# Patient Record
Sex: Female | Born: 1940 | Race: White | Hispanic: No | Marital: Married | Smoking: Former smoker
Health system: Southern US, Community
[De-identification: ages and names within clinical notes are randomized; demographics above are authoritative.]

## PROBLEM LIST (undated history)

## (undated) DIAGNOSIS — E785 Hyperlipidemia, unspecified: Secondary | ICD-10-CM

## (undated) DIAGNOSIS — I10 Essential (primary) hypertension: Secondary | ICD-10-CM

## (undated) DIAGNOSIS — E119 Type 2 diabetes mellitus without complications: Secondary | ICD-10-CM

---

## 1987-05-10 HISTORY — PX: ABDOMINAL HYSTERECTOMY: SHX81

## 1994-05-09 HISTORY — PX: CHOLECYSTECTOMY: SHX55

## 1998-05-09 HISTORY — PX: BLADDER SUSPENSION: SHX72

## 2000-05-09 HISTORY — PX: TRANSVAGINAL TAPE (TVT) REMOVAL: SHX6154

## 2005-05-09 HISTORY — PX: OTHER SURGICAL HISTORY: SHX169

## 2005-05-09 HISTORY — PX: HAND SURGERY: SHX662

## 2005-05-09 HISTORY — PX: CYSTOCELE REPAIR: SHX163

## 2021-03-16 ENCOUNTER — Emergency Department (HOSPITAL_COMMUNITY): Payer: Medicare HMO

## 2021-03-16 ENCOUNTER — Observation Stay (HOSPITAL_COMMUNITY)
Admission: EM | Admit: 2021-03-16 | Discharge: 2021-03-17 | Disposition: A | Discharge status: Home / Self Care | Payer: Medicare HMO | Attending: Emergency Medicine | Admitting: Emergency Medicine

## 2021-03-16 ENCOUNTER — Encounter (HOSPITAL_COMMUNITY): Payer: Self-pay

## 2021-03-16 DIAGNOSIS — E119 Type 2 diabetes mellitus without complications: Secondary | ICD-10-CM | POA: Diagnosis not present

## 2021-03-16 DIAGNOSIS — R4182 Altered mental status, unspecified: Secondary | ICD-10-CM | POA: Diagnosis present

## 2021-03-16 DIAGNOSIS — K219 Gastro-esophageal reflux disease without esophagitis: Secondary | ICD-10-CM | POA: Diagnosis not present

## 2021-03-16 DIAGNOSIS — R339 Retention of urine, unspecified: Secondary | ICD-10-CM | POA: Diagnosis present

## 2021-03-16 DIAGNOSIS — F419 Anxiety disorder, unspecified: Secondary | ICD-10-CM

## 2021-03-16 DIAGNOSIS — G4700 Insomnia, unspecified: Secondary | ICD-10-CM | POA: Diagnosis not present

## 2021-03-16 DIAGNOSIS — M1990 Unspecified osteoarthritis, unspecified site: Secondary | ICD-10-CM

## 2021-03-16 DIAGNOSIS — Z20822 Contact with and (suspected) exposure to covid-19: Secondary | ICD-10-CM | POA: Diagnosis not present

## 2021-03-16 DIAGNOSIS — Z79899 Other long term (current) drug therapy: Secondary | ICD-10-CM | POA: Diagnosis not present

## 2021-03-16 DIAGNOSIS — I10 Essential (primary) hypertension: Secondary | ICD-10-CM | POA: Diagnosis not present

## 2021-03-16 DIAGNOSIS — E785 Hyperlipidemia, unspecified: Secondary | ICD-10-CM | POA: Diagnosis present

## 2021-03-16 DIAGNOSIS — R531 Weakness: Secondary | ICD-10-CM | POA: Diagnosis not present

## 2021-03-16 HISTORY — DX: Hyperlipidemia, unspecified: E78.5

## 2021-03-16 HISTORY — DX: Type 2 diabetes mellitus without complications: E11.9

## 2021-03-16 HISTORY — DX: Essential (primary) hypertension: I10

## 2021-03-16 LAB — CBC WITH DIFFERENTIAL/PLATELET
Abs Immature Granulocytes: 0.01 10*3/uL (ref 0.00–0.07)
Basophils Absolute: 0 10*3/uL (ref 0.0–0.1)
Basophils Relative: 1 %
Eosinophils Absolute: 0.2 10*3/uL (ref 0.0–0.5)
Eosinophils Relative: 3 %
HCT: 38.9 % (ref 36.0–46.0)
Hemoglobin: 12.4 g/dL (ref 12.0–15.0)
Immature Granulocytes: 0 %
Lymphocytes Relative: 47 %
Lymphs Abs: 2.3 10*3/uL (ref 0.7–4.0)
MCH: 28.8 pg (ref 26.0–34.0)
MCHC: 31.9 g/dL (ref 30.0–36.0)
MCV: 90.5 fL (ref 80.0–100.0)
Monocytes Absolute: 0.5 10*3/uL (ref 0.1–1.0)
Monocytes Relative: 10 %
Neutro Abs: 1.9 10*3/uL (ref 1.7–7.7)
Neutrophils Relative %: 39 %
Platelets: 211 10*3/uL (ref 150–400)
RBC: 4.3 MIL/uL (ref 3.87–5.11)
RDW: 13.6 % (ref 11.5–15.5)
WBC: 4.9 10*3/uL (ref 4.0–10.5)
nRBC: 0 % (ref 0.0–0.2)

## 2021-03-16 LAB — I-STAT CHEM 8, ED
BUN: 18 mg/dL (ref 8–23)
Calcium, Ion: 1.09 mmol/L — ABNORMAL LOW (ref 1.15–1.40)
Chloride: 106 mmol/L (ref 98–111)
Creatinine, Ser: 0.6 mg/dL (ref 0.44–1.00)
Glucose, Bld: 99 mg/dL (ref 70–99)
HCT: 37 % (ref 36.0–46.0)
Hemoglobin: 12.6 g/dL (ref 12.0–15.0)
Potassium: 3.7 mmol/L (ref 3.5–5.1)
Sodium: 142 mmol/L (ref 135–145)
TCO2: 28 mmol/L (ref 22–32)

## 2021-03-16 LAB — COMPREHENSIVE METABOLIC PANEL
ALT: 22 U/L (ref 0–44)
AST: 24 U/L (ref 15–41)
Albumin: 3.1 g/dL — ABNORMAL LOW (ref 3.5–5.0)
Alkaline Phosphatase: 69 U/L (ref 38–126)
Anion gap: 8 (ref 5–15)
BUN: 15 mg/dL (ref 8–23)
CO2: 26 mmol/L (ref 22–32)
Calcium: 8.4 mg/dL — ABNORMAL LOW (ref 8.9–10.3)
Chloride: 107 mmol/L (ref 98–111)
Creatinine, Ser: 0.68 mg/dL (ref 0.44–1.00)
GFR, Estimated: >60 mL/min (ref >60)
Glucose, Bld: 95 mg/dL (ref 70–99)
Potassium: 3.8 mmol/L (ref 3.5–5.1)
Sodium: 141 mmol/L (ref 135–145)
Total Bilirubin: 0.5 mg/dL (ref 0.3–1.2)
Total Protein: 5.7 g/dL — ABNORMAL LOW (ref 6.5–8.1)

## 2021-03-16 LAB — RAPID URINE DRUG SCREEN, HOSP PERFORMED
Amphetamines: NOT DETECTED
Barbiturates: NOT DETECTED
Benzodiazepines: POSITIVE — AB
Cocaine: NOT DETECTED
Opiates: NOT DETECTED
Tetrahydrocannabinol: NOT DETECTED

## 2021-03-16 LAB — TSH: TSH: 1.398 u[IU]/mL (ref 0.350–4.500)

## 2021-03-16 LAB — RESP PANEL BY RT-PCR (FLU A&B, COVID) ARPGX2
Influenza A by PCR: NEGATIVE
Influenza B by PCR: NEGATIVE
SARS Coronavirus 2 by RT PCR: NEGATIVE

## 2021-03-16 LAB — URINALYSIS, ROUTINE W REFLEX MICROSCOPIC
Bilirubin Urine: NEGATIVE
Glucose, UA: NEGATIVE mg/dL
Hgb urine dipstick: NEGATIVE
Ketones, ur: NEGATIVE mg/dL
Leukocytes,Ua: NEGATIVE
Nitrite: NEGATIVE
Protein, ur: NEGATIVE mg/dL
Specific Gravity, Urine: 1.008 (ref 1.005–1.030)
pH: 7 (ref 5.0–8.0)

## 2021-03-16 LAB — PROTIME-INR
INR: 1.1 (ref 0.8–1.2)
Prothrombin Time: 14.1 seconds (ref 11.4–15.2)

## 2021-03-16 LAB — AMMONIA: Ammonia: 13 umol/L (ref 9–35)

## 2021-03-16 LAB — ETHANOL: Alcohol, Ethyl (B): <10 mg/dL (ref <10)

## 2021-03-16 LAB — LACTIC ACID, PLASMA: Lactic Acid, Venous: 1.4 mmol/L (ref 0.5–1.9)

## 2021-03-16 LAB — APTT: aPTT: 34 seconds (ref 24–36)

## 2021-03-16 IMAGING — CT CT ANGIO HEAD-NECK (W OR W/O PERF)
2 of 7 series · 8 of 33 positions shown · IV contrast (OMNI 350)
Comparison: None.

CLINICAL DATA: Neuro deficit, acute, stroke suspected

EXAM:
CT ANGIOGRAPHY HEAD AND NECK
TECHNIQUE: Multidetector CT imaging of the head and neck was performed using
the standard protocol during bolus administration of intravenous
contrast. Multiplanar CT image reconstructions and MIPs were
obtained to evaluate the vascular anatomy. Carotid stenosis
measurements (when applicable) are obtained utilizing NASCET
criteria, using the distal internal carotid diameter as the
denominator.
CONTRAST:  75mL OMNIPAQUE IOHEXOL 350 MG/ML SOLN

[Series 5: cta neck · axial · 0.50mm/px · z∈[-211,-89]mm · 2 of 157 slices shown]
[im 53/157  soft-tissue]
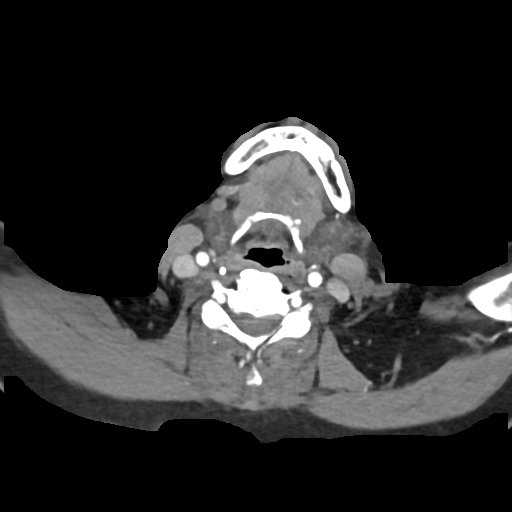
[im 105/157  soft-tissue]
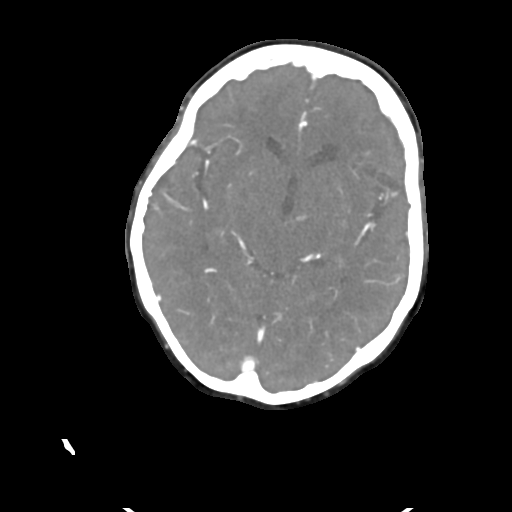

[Series 7: cta neck axial · axial · 0.39mm/px · z∈[-269,-36]mm · 6 of 325 slices shown]
[im 47/325  soft-tissue]
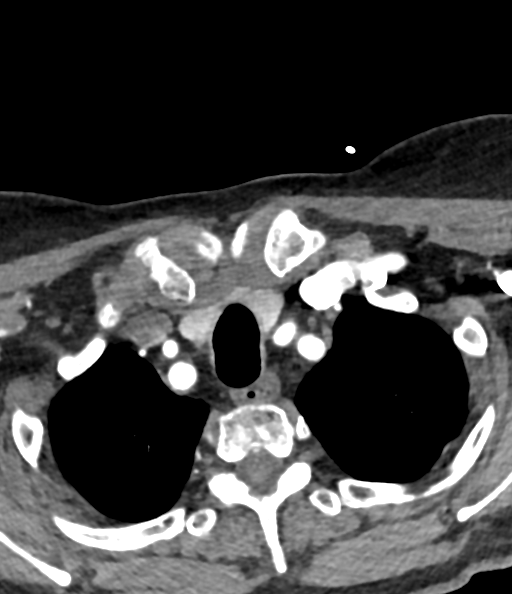
[im 93/325  bone]
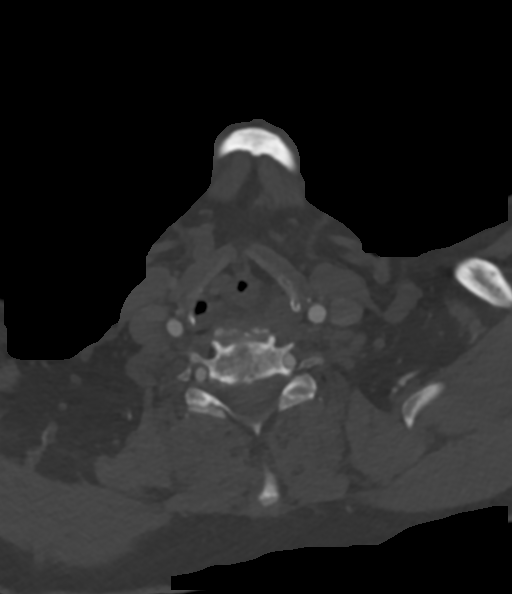
[im 139/325  soft-tissue]
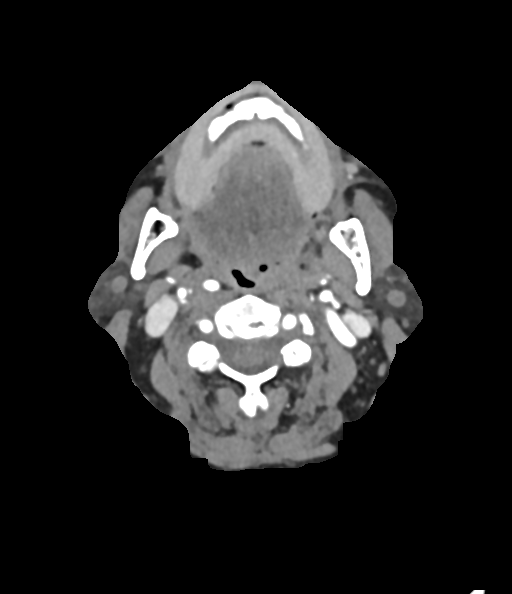
[im 186/325  bone]
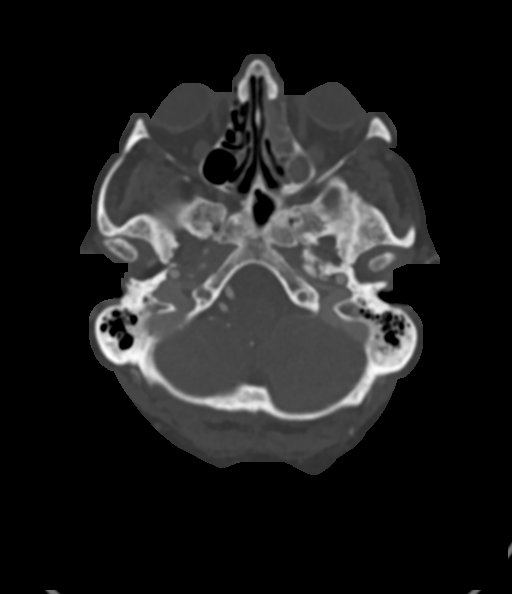
[im 232/325  soft-tissue]
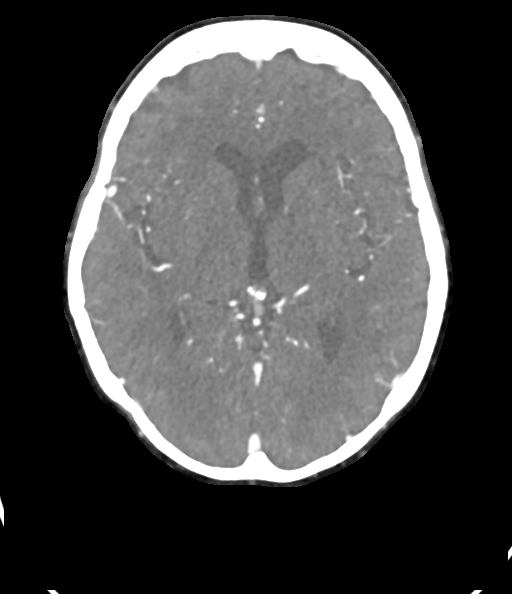
[im 278/325  bone]
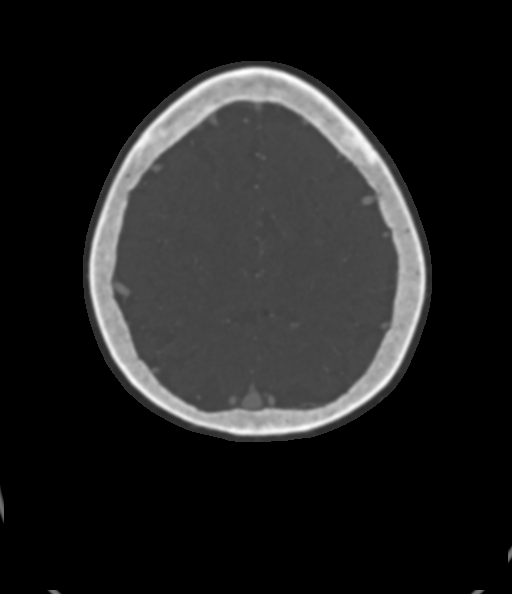

[8 of 33 positions shown; findings below may reference images not displayed]

FINDINGS: CTA NECK

Aortic arch: Minimal calcified plaque along the arch. Patent great
vessel origins.

Right carotid system: Patent. No stenosis. Partially retropharyngeal
course.

Left carotid system: Patent. Trace calcified plaque along the
proximal internal carotid. No stenosis.

Vertebral arteries: Patent.  Codominant.  No stenosis.

Skeleton: Cervical spine degenerative changes.

Other neck: Unremarkable.

Upper chest: 3 mm nodule of the left upper lobe (series 5, image
165).

Review of the MIP images confirms the above findings

CTA HEAD

Anterior circulation: Intracranial internal carotid arteries are
patent with mild calcified plaque. Anterior and middle cerebral
arteries are patent.

Posterior circulation: Intracranial vertebral arteries are patent.
Basilar artery is patent. Major cerebellar artery origins are
patent. Posterior cerebral arteries are patent.

Venous sinuses: Patent as allowed by contrast bolus timing.

Review of the MIP images confirms the above findings
IMPRESSION: No large vessel occlusion, hemodynamically significant stenosis, or
evidence of dissection.

3 mm left upper lobe pulmonary nodule. No follow-up needed if
patient is low-risk. Non-contrast chest CT can be considered in 12
months if patient is high-risk. This recommendation follows the
consensus statement: Guidelines for Management of Incidental
Pulmonary Nodules Detected on CT Images: From the [HOSPITAL]

## 2021-03-16 IMAGING — MR MR HEAD W/O CM
6 of 10 series · 29 of 48 positions shown · non-contrast
Comparison: None.

CLINICAL DATA: Neuro deficit, acute, stroke suspected

EXAM:
MRI HEAD WITHOUT CONTRAST
TECHNIQUE: Multiplanar, multiecho pulse sequences of the brain and surrounding
structures were obtained without intravenous contrast.

[Series 2: DWI · axial · 3.0mm · 0.94mm/px · z∈[-129,+18]mm · 9 of 100 slices shown (1 of 2)]
[im 1/100]
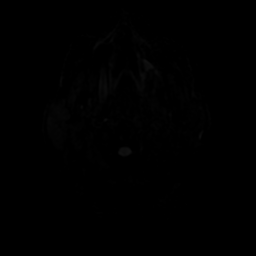
[im 13/100]
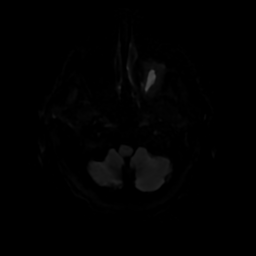
[im 25/100]
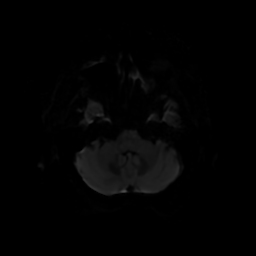
[im 38/100]
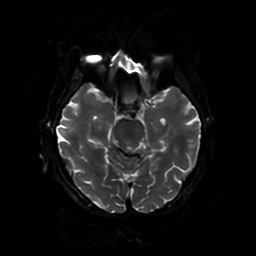
[im 50/100]
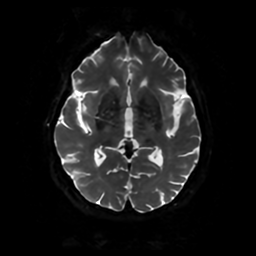
[im 62/100]
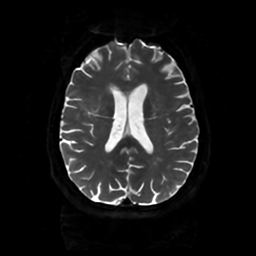
[im 75/100]
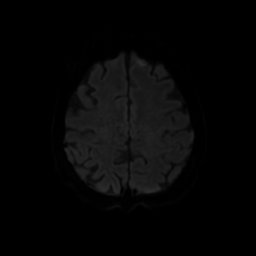
[im 87/100]
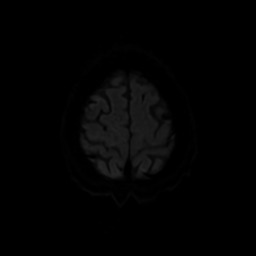
[im 100/100]
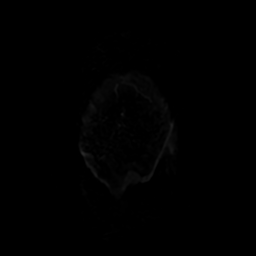

[Series 3: DWI · coronal · 4.0mm · 0.94mm/px · 7 of 72 slices shown (2 of 2)]
[im 1/72]
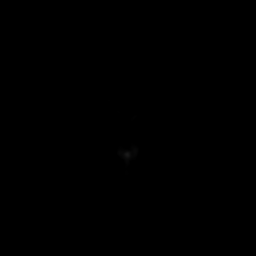
[im 12/72]
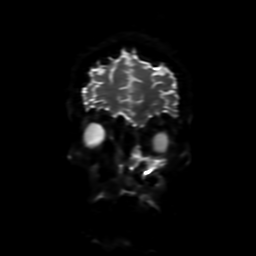
[im 24/72]
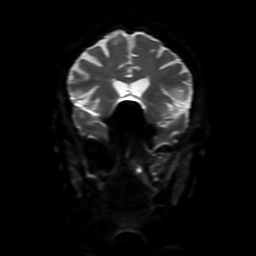
[im 36/72]
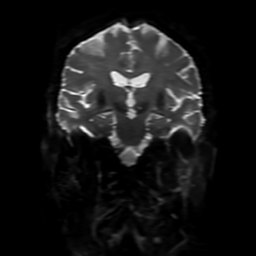
[im 48/72]
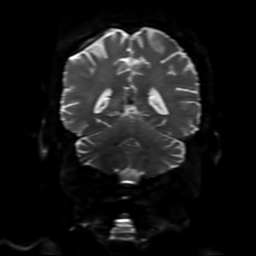
[im 60/72]
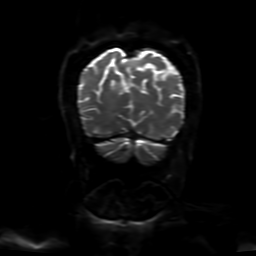
[im 72/72]
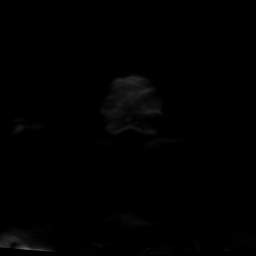

[Series 4: FLAIR · sagittal · 5.0mm · 0.23mm/px · 2 of 24 slices shown (1 of 2)]
[im 1/24]
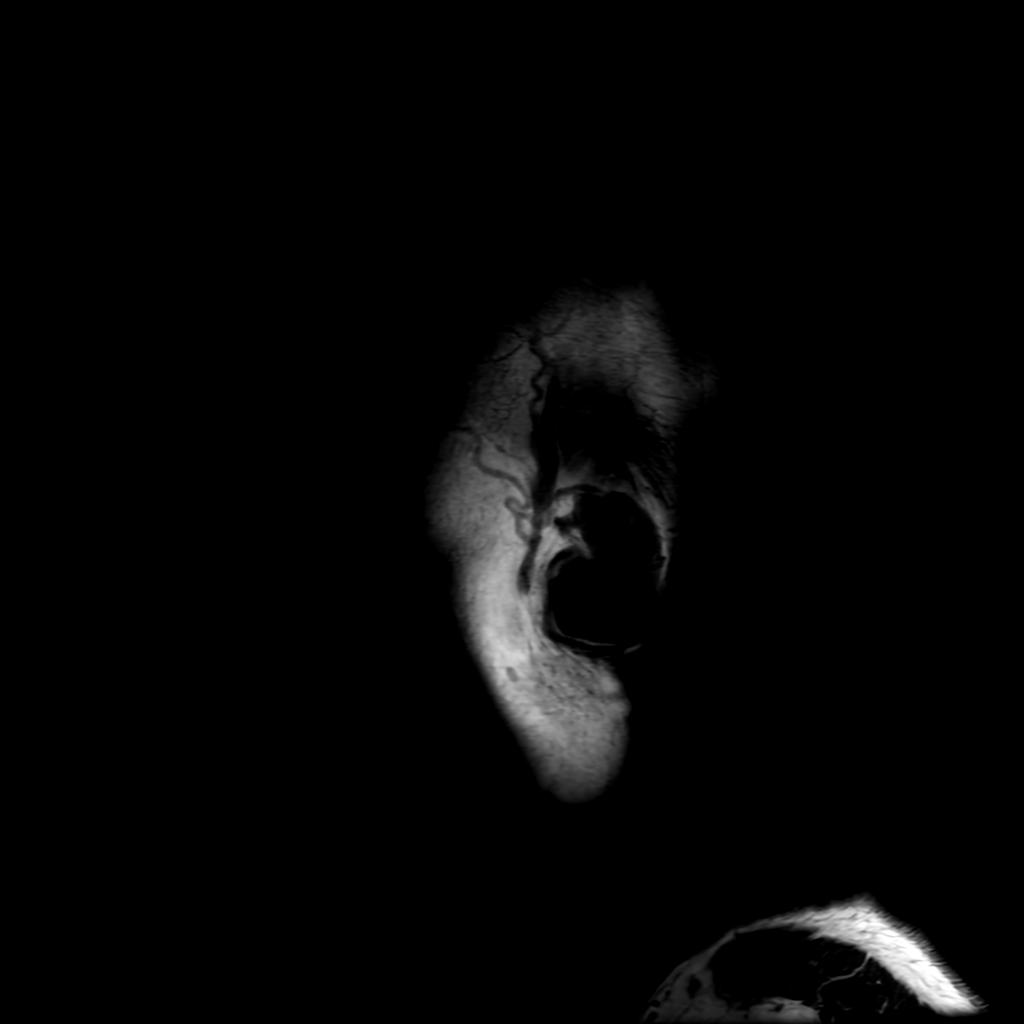
[im 24/24]
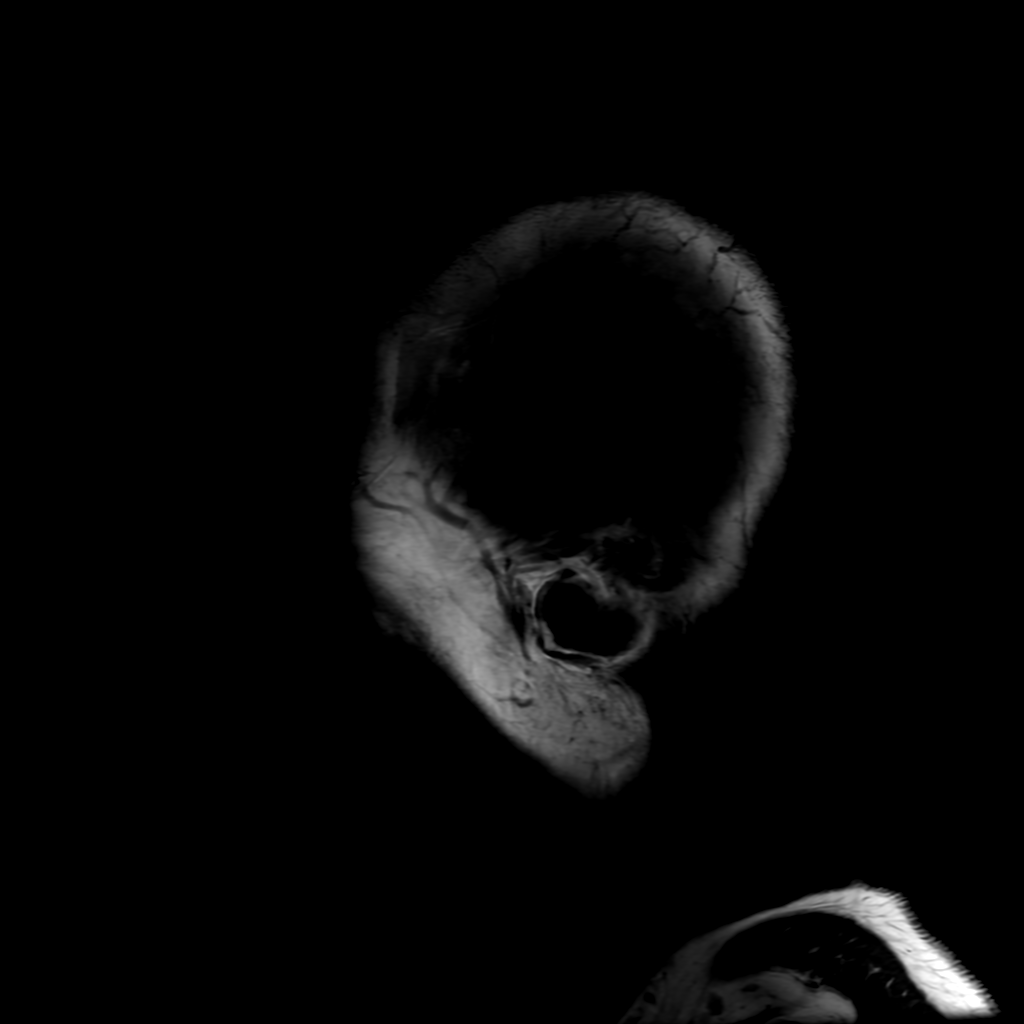

[Series 6: FLAIR · axial · 4.0mm · 0.45mm/px · z∈[-121,+24]mm · 3 of 34 slices shown (2 of 2)]
[im 1/34]
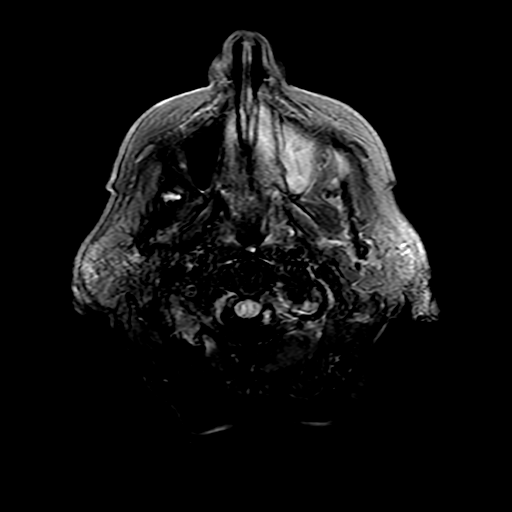
[im 17/34]
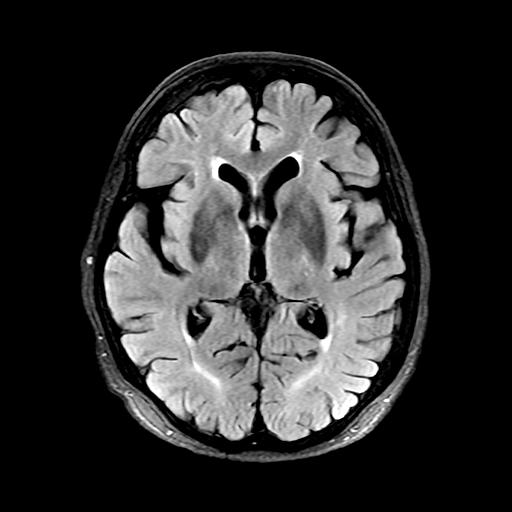
[im 34/34]
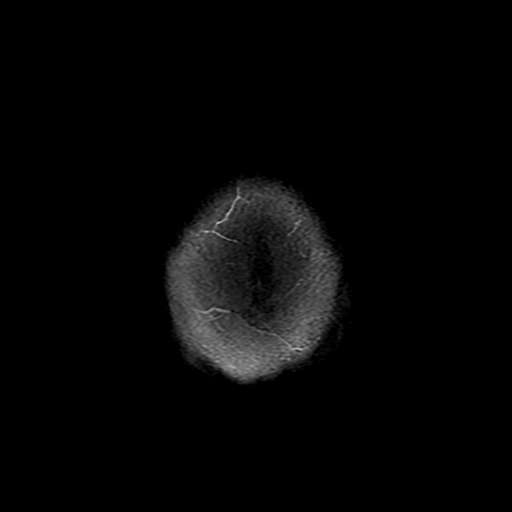

[Series 250: ADC · axial · 3.0mm · 0.94mm/px · z∈[-129,+18]mm · 5 of 50 slices shown (1 of 2)]
[im 1/50]
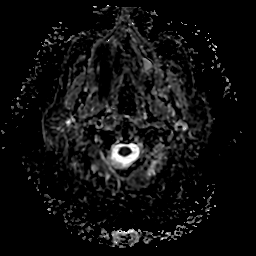
[im 13/50]
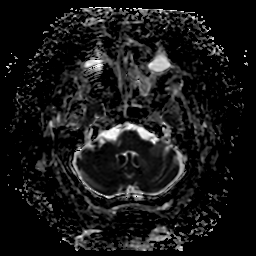
[im 25/50]
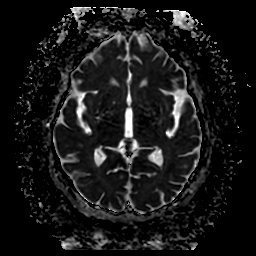
[im 37/50]
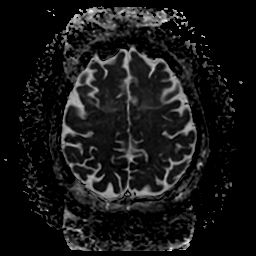
[im 50/50]
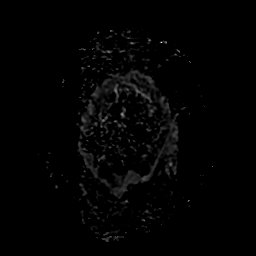

[Series 350: ADC · coronal · 4.0mm · 0.94mm/px · 3 of 37 slices shown (2 of 2)]
[im 1/37]
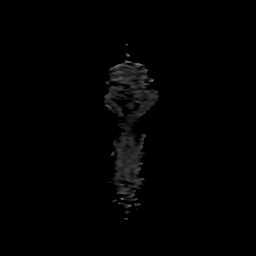
[im 19/37]
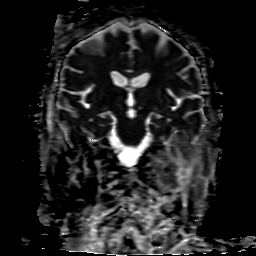
[im 37/37]
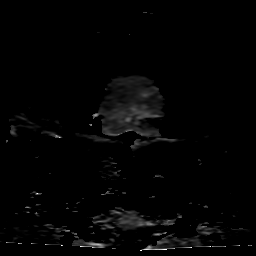

[29 of 48 positions shown; findings below may reference images not displayed]

FINDINGS: Brain: There is no acute infarction or intracranial hemorrhage.
There is no intracranial mass, mass effect, or edema. There is no
hydrocephalus or extra-axial fluid collection. Prominence of the
ventricles and sulci reflects parenchymal volume loss. Patchy T2
hyperintensity in the supratentorial white matter is nonspecific but
may reflect mild chronic microvascular ischemic changes.

Vascular: Major vessel flow voids at the skull base are preserved.

Skull and upper cervical spine: Normal marrow signal is preserved.
Cervical spine degenerative changes.

Sinuses/Orbits: Opacified left maxillary anterior ethmoid sinuses
and. Patchy mucosal thickening elsewhere. Bilateral lens
replacements.

Other: Sella is unremarkable.  Mastoid air cells are clear.
IMPRESSION: No acute infarction, hemorrhage, or mass. Mild chronic microvascular
ischemic changes.

## 2021-03-16 IMAGING — MR MR CERVICAL SPINE W/O CM
4 of 5 series · 19 of 48 positions shown · non-contrast
Comparison: None.

CLINICAL DATA: Motor neuron disease

EXAM:
MRI CERVICAL SPINE WITHOUT CONTRAST
TECHNIQUE: Multiplanar, multisequence MR imaging of the cervical spine was
performed. No intravenous contrast was administered.

[Series 2: STIR · sagittal · 3.0mm · 0.43mm/px · 3 of 16 slices shown]
[im 4/16]
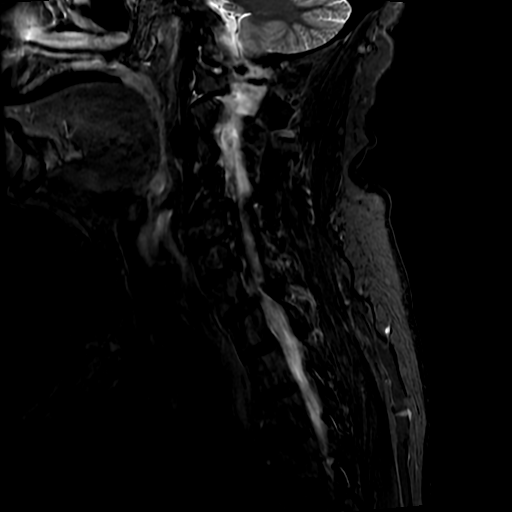
[im 10/16]
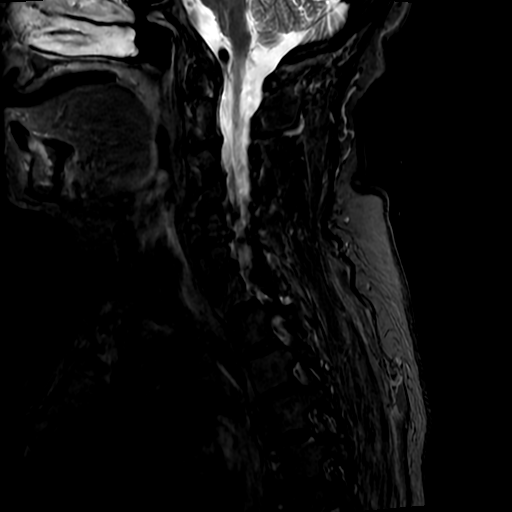
[im 16/16]
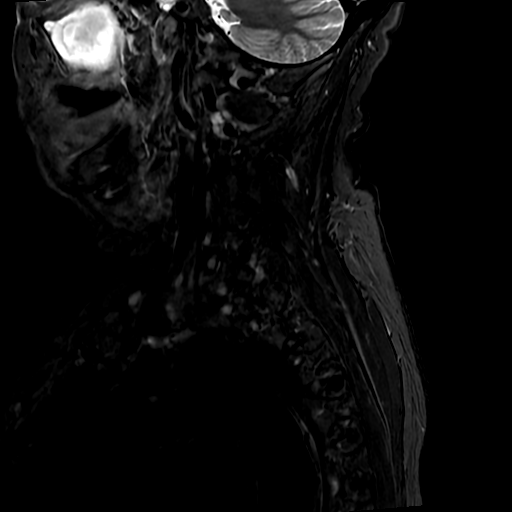

[Series 3: T2 · sagittal · 3.0mm · 0.43mm/px · 5 of 16 slices shown (1 of 2)]
[im 1/16]
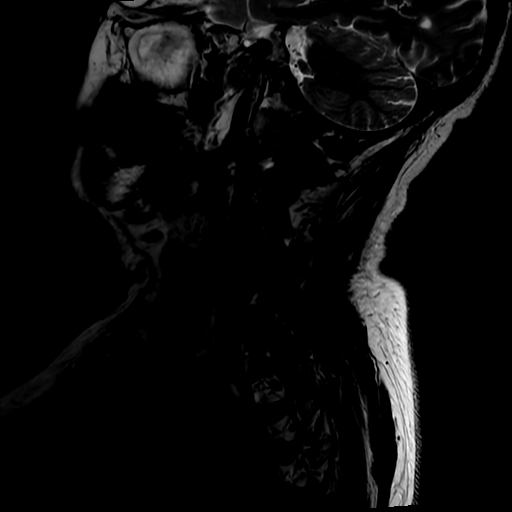
[im 4/16]
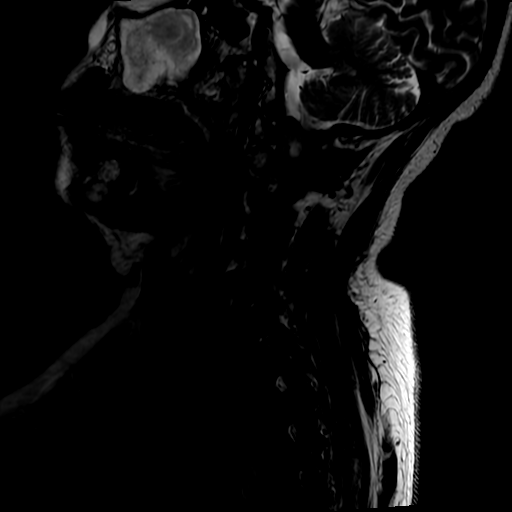
[im 8/16]
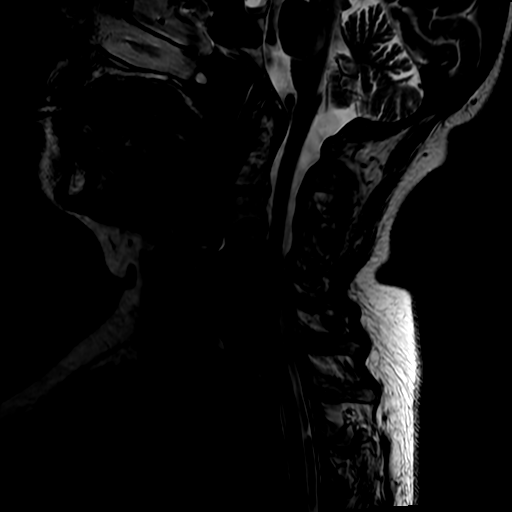
[im 12/16]
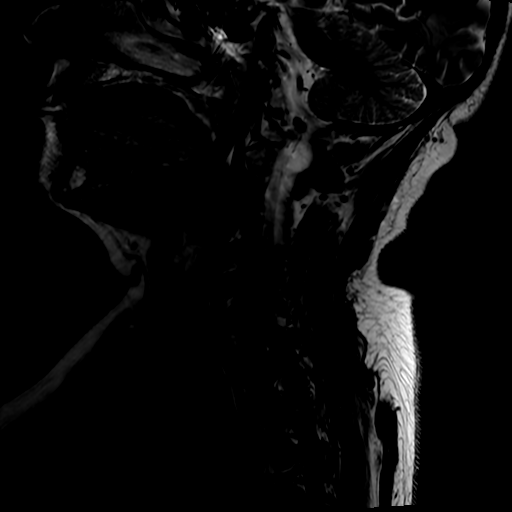
[im 16/16]
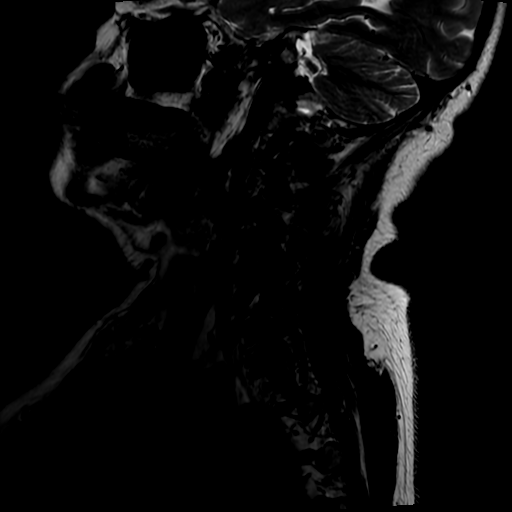

[Series 3: FLAIR · sagittal · 3.0mm · 0.43mm/px · 3 of 16 slices shown]
[im 1/16]
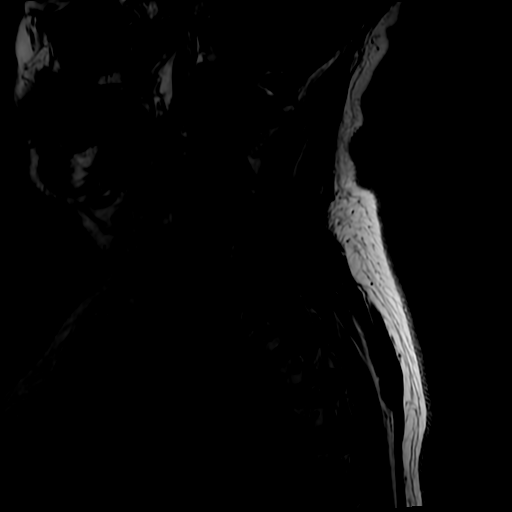
[im 8/16]
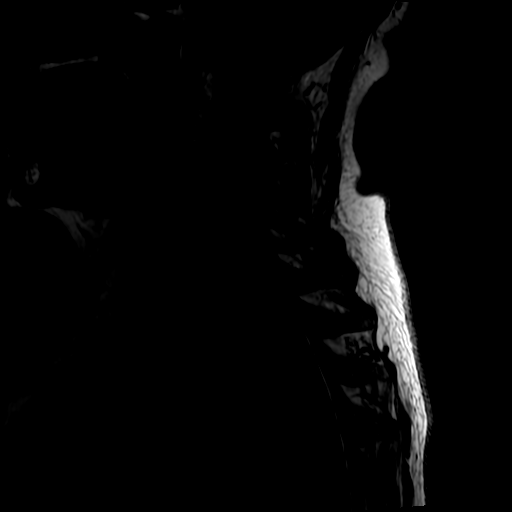
[im 16/16]
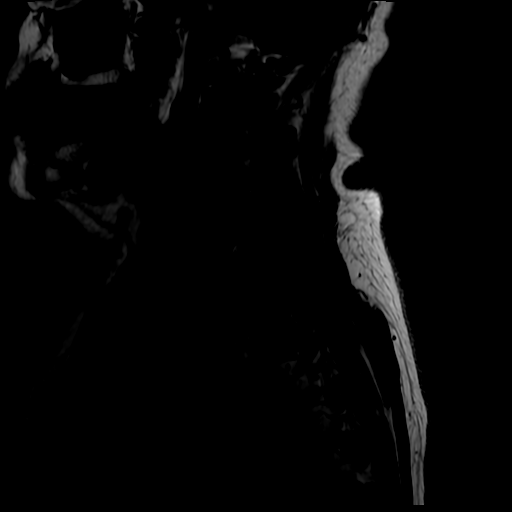

[Series 5: T2 · axial · 3.0mm · 0.35mm/px · z∈[-255,-173]mm · 8 of 26 slices shown (2 of 2)]
[im 1/26]
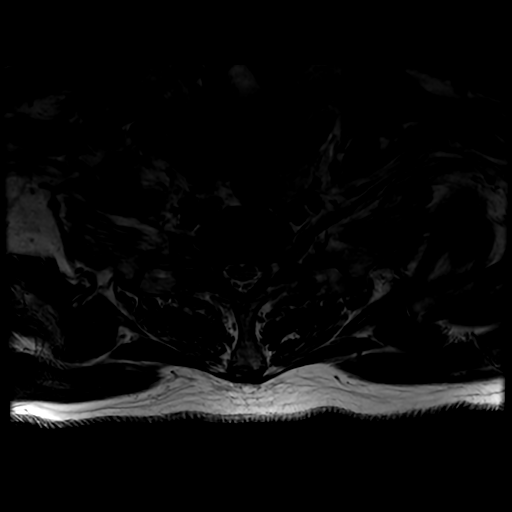
[im 4/26]
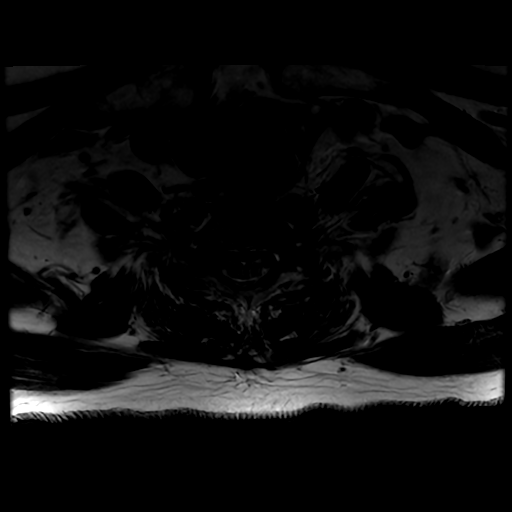
[im 8/26]
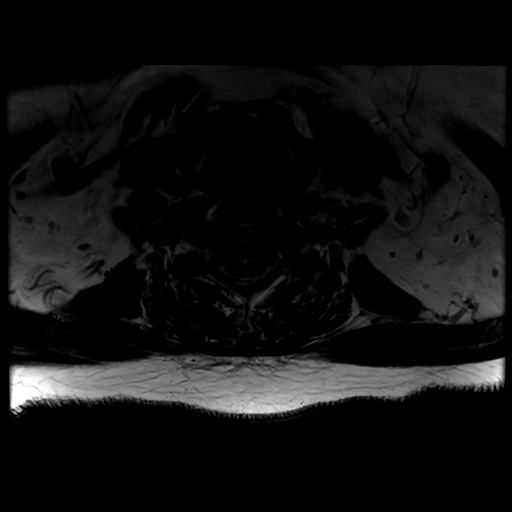
[im 11/26]
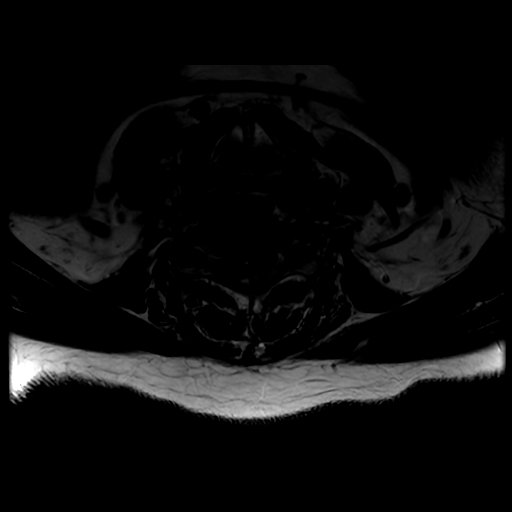
[im 15/26]
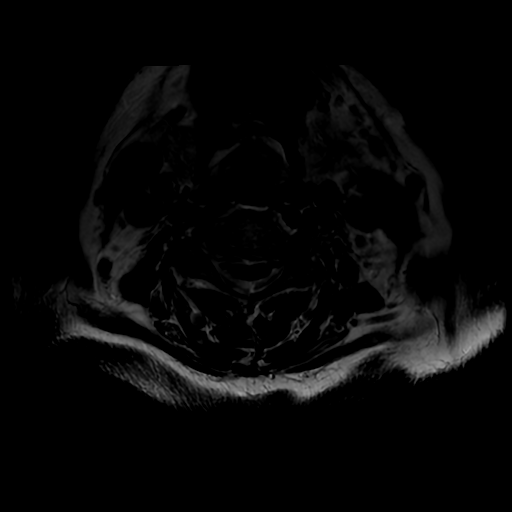
[im 18/26]
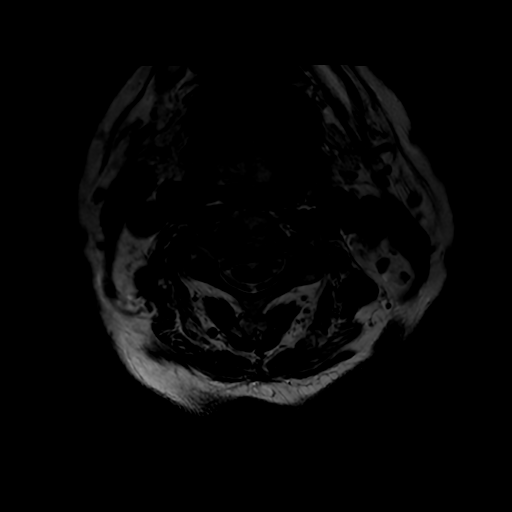
[im 22/26]
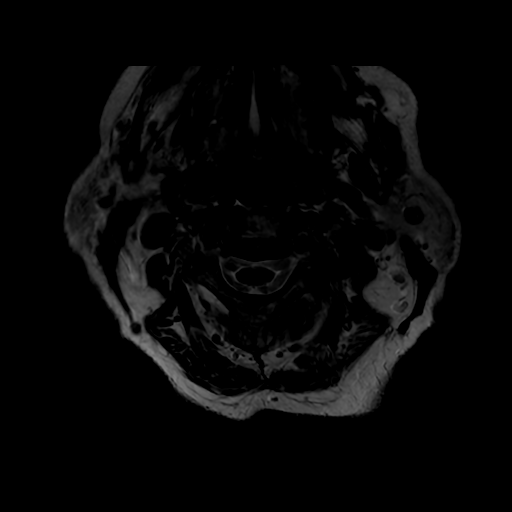
[im 26/26]
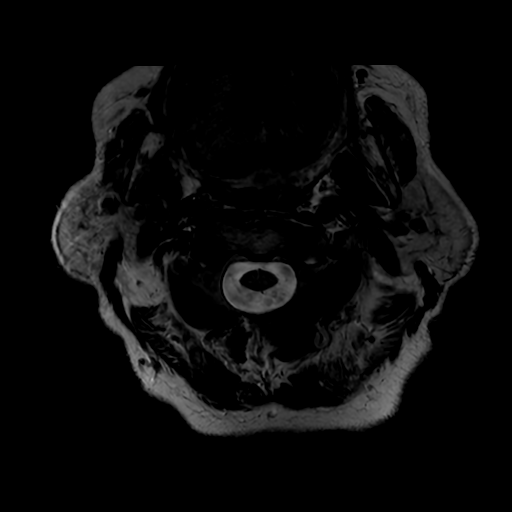

[19 of 48 positions shown; findings below may reference images not displayed]

FINDINGS: Motion artifact is present.

Alignment: Mild retrolisthesis at C4-C5 and C5-C6.

Vertebrae: Degenerative endplate irregularity, greatest at C4-C5 and
C5-C6. Vertebral body heights are otherwise maintained. There is no
substantial marrow edema.

Cord: No abnormal signal.

Posterior Fossa, vertebral arteries, paraspinal tissues:
Unremarkable.

Disc levels:

C2-C3: Facet hypertrophy.  No canal or foraminal stenosis.

C3-C4: Minimal disc bulge. Facet hypertrophy. No canal or foraminal
stenosis.

C4-C5: Disc bulge with endplate osteophytes. Facet and uncovertebral
hypertrophy. No canal or left foraminal stenosis. Mild right
foraminal stenosis.

C5-C6: Disc bulge with endplate osteophytes. Uncovertebral and facet
hypertrophy. Mild canal stenosis. Mild right and moderate left
foraminal stenosis.

C6-C7: Disc bulge with endplate osteophytes. Uncovertebral
hypertrophy. No canal or foraminal stenosis.

C7-T1: Facet hypertrophy. No canal stenosis. Mild foraminal
stenosis.
IMPRESSION: Suboptimal valuation due to motion degradation. No abnormal cord
signal. Multilevel degenerative changes without high-grade canal
stenosis. Foraminal stenosis ranges from mild to moderate.

## 2021-03-16 IMAGING — CT CT HEAD W/O CM
4 series · 16 of 47 positions shown, 18 images · non-contrast
Comparison: None.

CLINICAL DATA: Mental status change, unknown cause

EXAM:
CT HEAD WITHOUT CONTRAST
TECHNIQUE: Contiguous axial images were obtained from the base of the skull
through the vertex without intravenous contrast.

[Series 3: head without · axial · non-contrast · 0.43mm/px · z∈[-102,+18]mm · 7 of 32 slices shown, 9 images]
[im 4/32  brain]
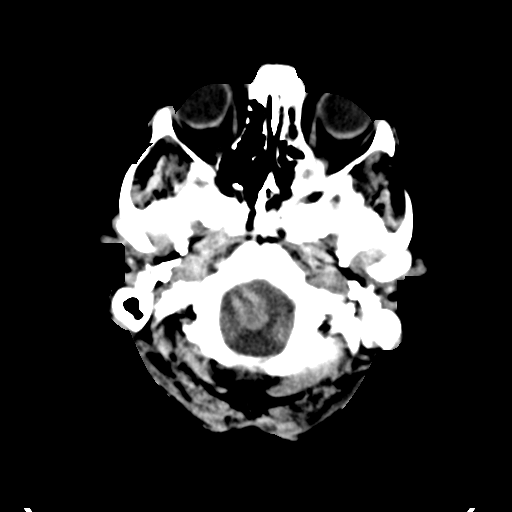
[im 4/32  bone]
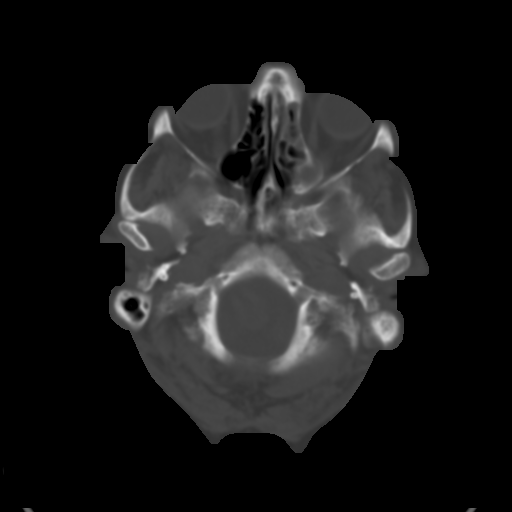
[im 8/32  brain]
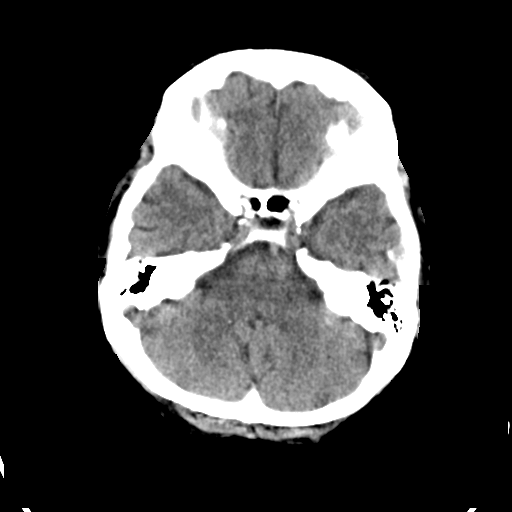
[im 12/32  brain]
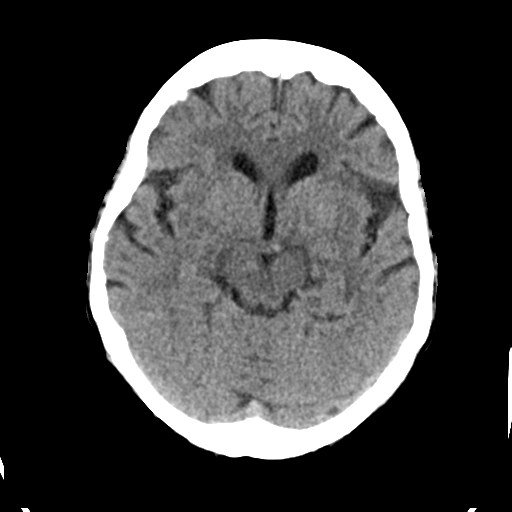
[im 16/32  brain]
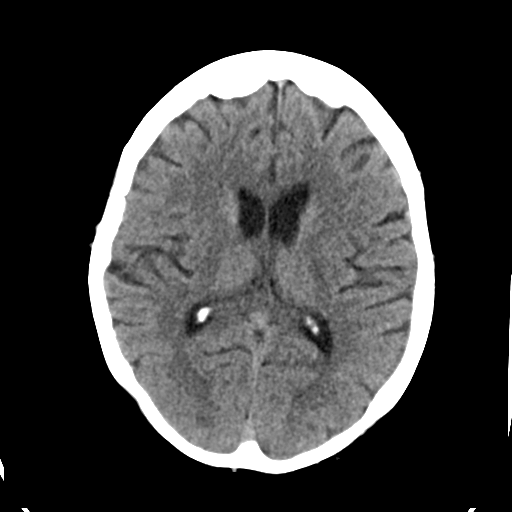
[im 20/32  brain]
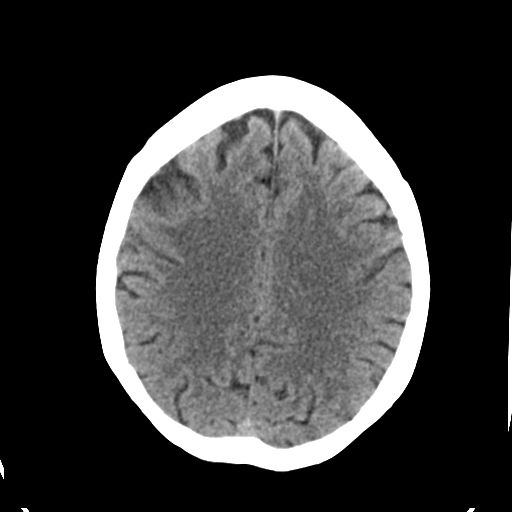
[im 20/32  bone]
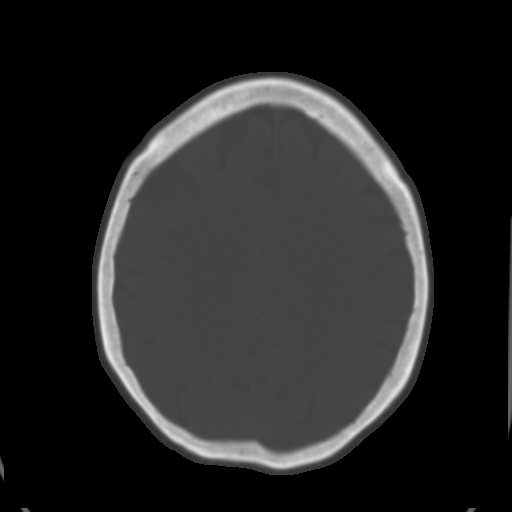
[im 24/32  brain]
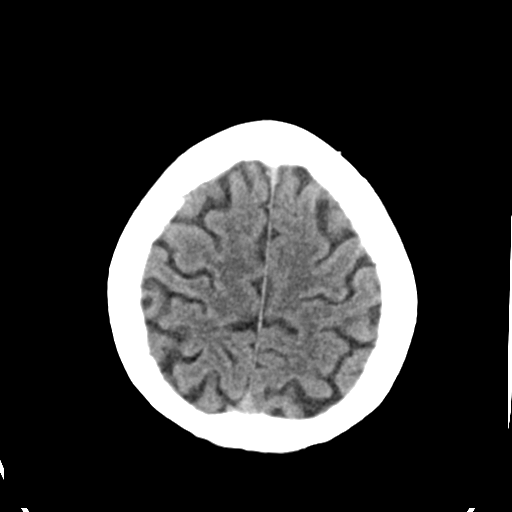
[im 28/32  brain]
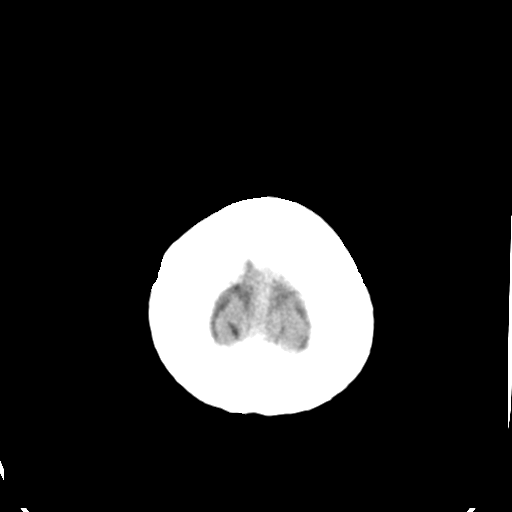

[Series 4: head bone · axial · 0.43mm/px · z∈[-103,-71]mm · 3 of 79 slices shown]
[im 8/79  bone]
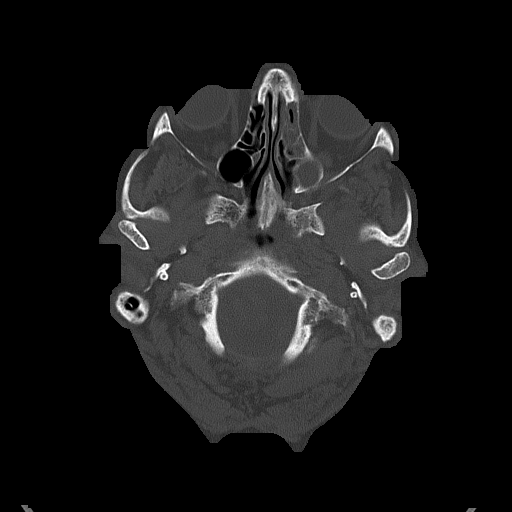
[im 16/79  bone]
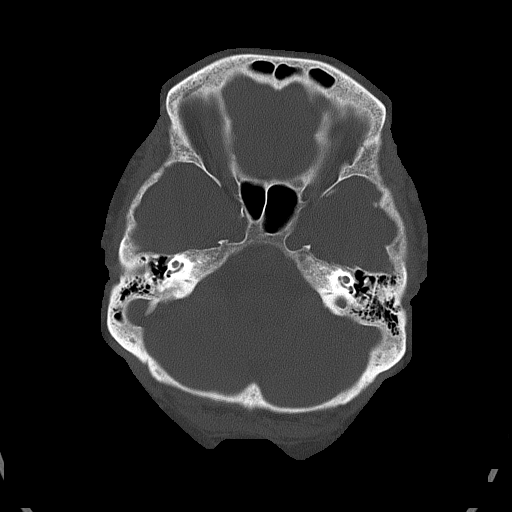
[im 24/79  bone]
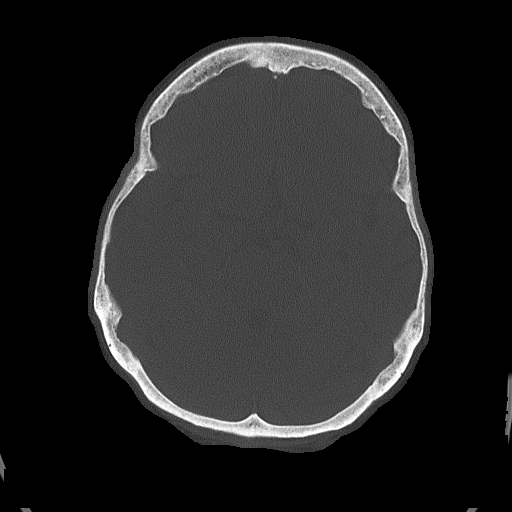

[Series 5: head without cor · coronal · non-contrast · 0.33mm/px · 3 of 62 slices shown]
[im 21/62  brain]
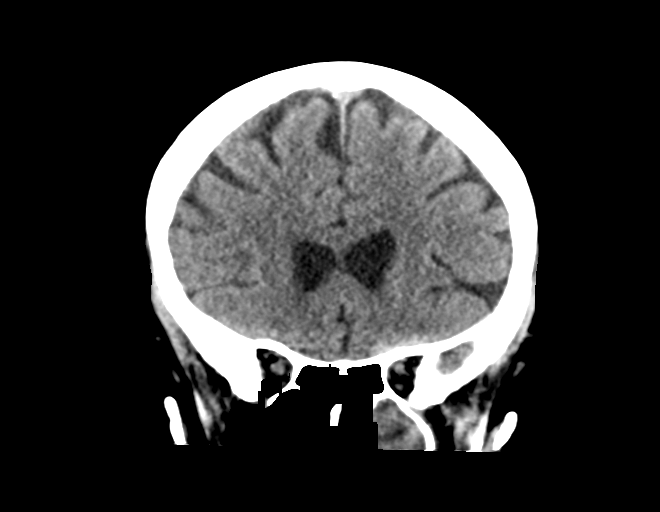
[im 28/62  brain]
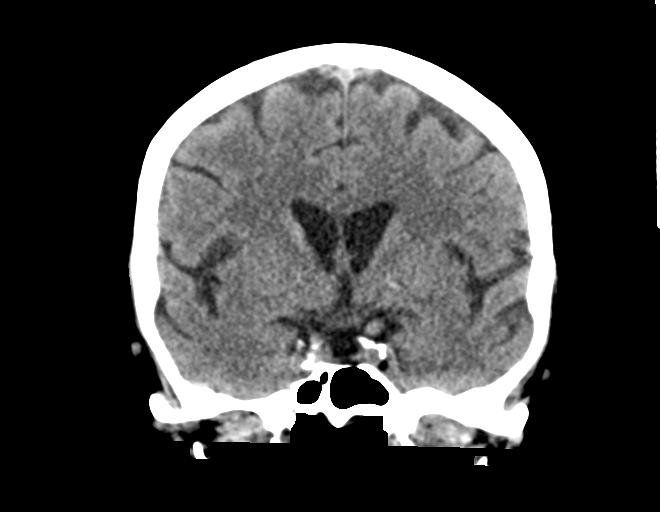
[im 34/62  brain]
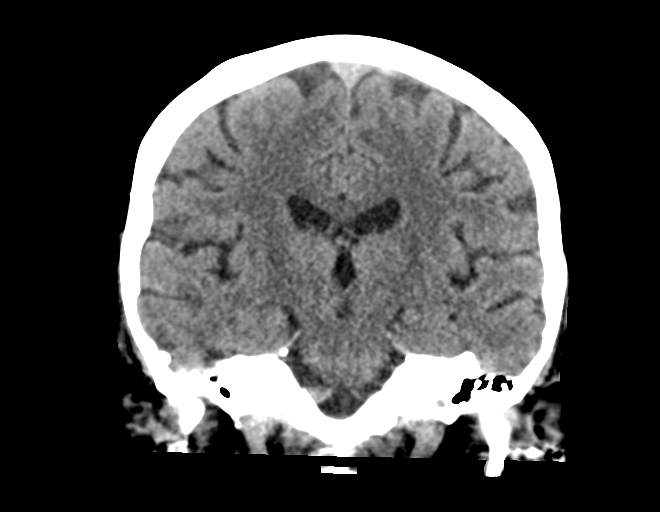

[Series 6: head without sag · sagittal · non-contrast · 0.33mm/px · 3 of 53 slices shown]
[im 18/53  brain]
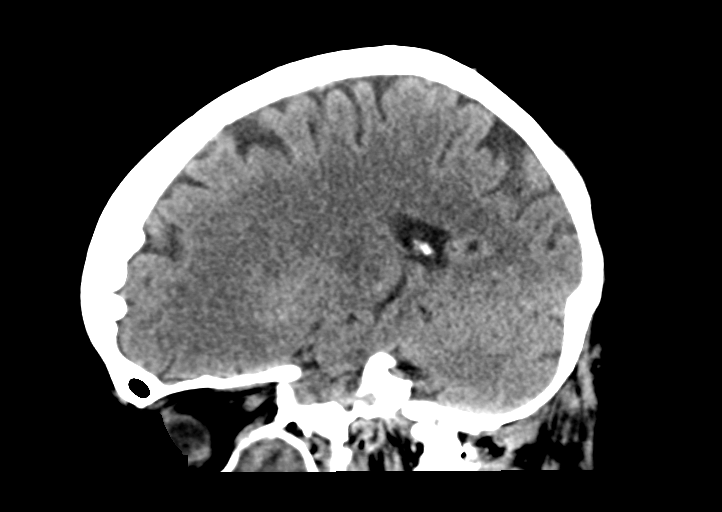
[im 27/53  brain]
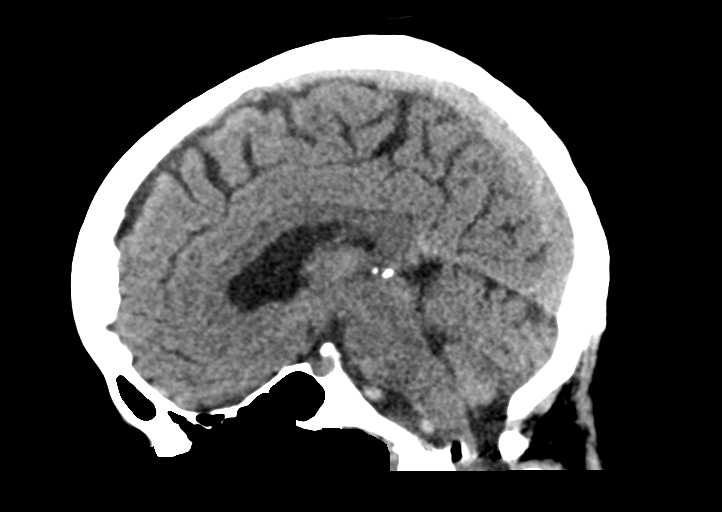
[im 35/53  brain]
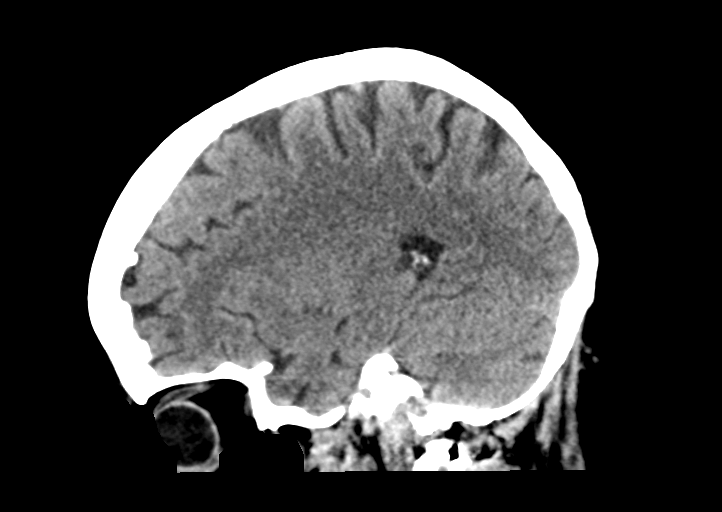

[16 of 47 positions shown; findings below may reference images not displayed]

FINDINGS: Brain: No evidence of parenchymal hemorrhage or extra-axial fluid
collection. No mass lesion, mass effect, or midline shift. No CT
evidence of acute infarction. Cerebral volume is age appropriate. No
ventriculomegaly.

Vascular: No acute abnormality.

Skull: No evidence of calvarial fracture.

Sinuses/Orbits: Complete opacification of the visualized left
maxillary sinus. Mucoperiosteal thickening in the left greater than
right ethmoidal air cells. No fluid levels.

Other:  The mastoid air cells are unopacified.
IMPRESSION: 1. No evidence of acute intracranial abnormality.
2. Chronic appearing paranasal sinusitis.

## 2021-03-16 MED ORDER — IOHEXOL 350 MG/ML SOLN
75.0000 mL | Freq: Once | INTRAVENOUS | Status: AC | PRN
  Administered 2021-03-16: 75 mL via INTRAVENOUS

## 2021-03-16 MED ORDER — ENOXAPARIN SODIUM 40 MG/0.4ML IJ SOSY
40.0000 mg | PREFILLED_SYRINGE | INTRAMUSCULAR | Status: DC
  Administered 2021-03-17: 40 mg via SUBCUTANEOUS
  Filled 2021-03-16: qty 0.4

## 2021-03-16 MED ORDER — SODIUM CHLORIDE 0.9% FLUSH: 3.0000 mL | INTRAVENOUS | Status: DC | PRN

## 2021-03-16 MED ORDER — SODIUM CHLORIDE 0.9% FLUSH
3.0000 mL | Freq: Two times a day (BID) | INTRAVENOUS | Status: DC
  Administered 2021-03-17 (×2): 3 mL via INTRAVENOUS

## 2021-03-16 MED ORDER — SODIUM CHLORIDE 0.9 % IV SOLN: 250.0000 mL | INTRAVENOUS | Status: DC | PRN

## 2021-03-16 MED ORDER — INSULIN ASPART 100 UNIT/ML IJ SOLN
0.0000 [IU] | Freq: Three times a day (TID) | INTRAMUSCULAR | Status: DC
  Administered 2021-03-17: 1 [IU] via SUBCUTANEOUS
  Administered 2021-03-17: 2 [IU] via SUBCUTANEOUS

## 2021-03-16 MED ORDER — ACETAMINOPHEN 325 MG PO TABS
650.0000 mg | ORAL_TABLET | Freq: Once | ORAL | Status: AC
  Administered 2021-03-16: 650 mg via ORAL
  Filled 2021-03-16: qty 2

## 2021-03-16 MED ORDER — SODIUM CHLORIDE 0.9 % IV BOLUS
1000.0000 mL | Freq: Once | INTRAVENOUS | Status: AC
  Administered 2021-03-16: 1000 mL via INTRAVENOUS

## 2021-03-16 NOTE — ED Triage Notes (Addendum)
Pt BIB ems from daughter's house; visiting from out of town; called out for AMS; LSN 2130 last night; pt endorses being "Sleepy", and had a HA last night, bilateral hand weakness, dry mouth; denies HA currently; family endorses increase in falls, slurred speech, weakness; ambulatory to ambulance with assistance  148/90 HR 76 CBG 121 98% RA

## 2021-03-16 NOTE — Progress Notes (Signed)
Family medicine teaching service will be admitting this patient. Our pager information can be located in the physician sticky notes, treatment team sticky notes, and the headers of all our official daily progress notes.   FAMILY MEDICINE TEACHING SERVICE Patient - Please contact intern pager [phone] or text page via website [email or web address] (login: [email or web address]) for questions regarding care. DO NOT page listed attending provider unless there is no answer from the number above.   Sharline Lehane, MD PGY-2, [facility] pager [phone]   

## 2021-03-16 NOTE — ED Provider Notes (Signed)
  7:48 PM Neuro has been delayed due to numerous code strokes.  Recommend medical admission, they will see in consult once time permits.  Patient has been able to get up and move about a little, still unsteady on her feet requiring assist and not back to her baseline.  Family is agreeable to admission.  Discussed with family practice teaching service-- will admit for ongoing care.     Garlon Hatchet, PA-C 03/16/21 2052    Linwood Dibbles, MD 03/17/21 [phone]

## 2021-03-16 NOTE — ED Notes (Signed)
Pt to MRI

## 2021-03-16 NOTE — Hospital Course (Addendum)
Patient is a 80 year old female presenting with weakness, dysarthria and altered mental status. PMH of T2DM, HTN, HLD, arthritis, acid reflux, and urinary incontinence.  Weakness  AMS Resolved by the time of admission to FPTS. Likely 2/2 medication-induced urinary retention, but cannot fully rule out TIA. CT Head and MRI Head WNL.  Per neuro: Okay to discharge with outpatient workup and Neurology evaluation. Patient is from [place] and just visiting family in [place] and reports already scheduled for Neurology follow up appointment outpatient on Jan 3rd.  Urinary Retention Patient straight cath 800 cc void in ED. On the unit, bladder scan showed retention of 497 mL retained; straight cath 600 cc. Urology to place foley catheter. Home amitriptyline will be decreased to 37.5mg  on discharge and advise discontinuation of Xanax, as additive effect can cause urinary retention.    Discharge recommendations: Incidental 3 mm left upper lobe pulmonary nodule found 11/8 on CTA head/neck. No follow-up needed if patient is low-risk. Non-contrast chest CT can be considered in 12 months if patient is high-risk. Amitriptyline dosage decreased to 37.5mg  at discharge; take one half of the pill instead of the full 75 mg tablet. Consider decreasing amount of sedating medications/potential medication interactions with polypharmacy in elderly (Xanax in combination with Amitriptyline) Urology placed foley catheter for urinary retention. Follow-up with PCP for management and safe removal.

## 2021-03-16 NOTE — ED Notes (Signed)
Pt unable to void, successful straight cath for urine. 800cc clear yellow urine, specimen obtained.

## 2021-03-16 NOTE — Progress Notes (Deleted)
Family Medicine Teaching [facility] Admission History and Physical Service Pager: [phone]  Patient name: Jasmine Berg Medical record number: [id number] Date of birth: 1940/12/02 Age: 80 y.o. Gender: female  Primary Care Provider:  Julian Reil, MD in [facility], [place] Consultants: Neurology Code Status: Full code  Preferred Emergency Contact: Husband Jasmine Berg [phone]  Chief Complaint: weakness and AMS  Assessment and Plan: Jasmine Berg is a 80 y.o. female presenting with weakness and speech changes . PMH is significant for T2DM, HTN, HLD, arthritis, acid reflux, urinary incontinence.   Weakness and AMS Patient presented to the ED with weakness and speech changes; last known normal was 9:30 pm 03/15/21. This morning upon waking, family felt she was weak and not speaking clearly, so brought her in. Upon presentation to ED, she was moderately hypertensive with otherwise stable vital signs. ED provider found her to be dysarthric, have abnormal finger-to-nose, and unable to stand without assistance. Labs notable only for UDS positive or benzos. She had a CMP that was unremarkable when calcium corrected for albumin, CBC, NH4, Lactic acid, and ethanol levels where all wnl. UDS was positive for benzodiazepines and UA was wnl.  EKG was sinus rhythm. Head imaging negative for acute processes including stroke. CT Angio head and neck showed no large vessel occlusion, hemodynamically significant stenosis or evidence of dissection, with 3 mm left upper lobe pulmonary nodule. MR brain showed no acute infarction, hemorrhage, or mass, with mild chronic microvascular ischemic changes. CT head showed no evidence of acute intracranial abnormality. MR cervical spine showed no abnormal cord signal, with multilevel degenerative changes, without high-grade canal stenosis. On admission, her vitals were stable, on physical exam she was alert and oriented to self/place/date, with regular speech  and fluidity. Strength was 5/5 in LE and 4/5 in upper extremities. Differential remains broad at this time, but includes CVA vs TIA, polypharmacy, electrolyte abnormality such as hypokalemia, hypoglycemia, and infection. Etiology of the AMS not 2/2 to CVA given imaging, could be TIA given resolution of symptoms and return to baseline with mental status. Etiology could be 2/2 polypharmacy given positive UDS for benzodiazepines, and return to baseline. CMP and fluid status were wnl, less concerning for an electrolyte abnormality. Given normal CBC and resolution of symptoms, less concerned for infectious etiology. Unlikely hypoglycemia, as patient was euglycemic on presentation and resolved withou administration of dextrose; however she does take glipizide for T2DM so possible accidental extra ingestion. Neurology consulted by ED provider, will see her as soon as possible.  -Admit to med-tele with FPTS, Dr. Burley Saver attending -Appreciate neurology recommendations -Awaiting blood cultures -Vitals per unit routine -continuous cardiac monitoring -OT/PT eval and treat -Continuous Pulse Ox -Regular diet  Urinary Retention  Hx urinary incontinence  Patient unable to void at admission. Straight cath 800 cc void. Patient reports history of difficulty voiding 2-3 times a week, usually at night, that self-resolves. Patient has notable surgical history related to urinary incontinence including Bladder suspension 2000, Transvaginal tape 2002 and 2007, Cystocele Repair 2007. - bladder scan q8 hours x 36 hours - may consider straight cath PRN for urinary retention  Hypoalbuminemia Albumin 3.1 on admission, likely some component of mild to moderate malnutrition.  - consult dietician  HTN Hypertensive on admission with systolics 150s-180s, diastolics 70s-90s. Stroke r/o with head imaging. Neuro to see. Home meds - Losartan potassium 100 mg, Norvasc 5 mg, low dose aspirin 81 mg.  - continue losartan -  continue amlodipine  T2DM Patient reports she takes Glipizide-Meformin  2.5mg -500 mg BID.  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 Patient unsure of exact reason for meds. Does say xanax helps her to sleep. Of note, UDS on admission positive for benzos, but patient remembers last doze xanax was Sunday.  - continue amitriptyline - continue fluoxetine - hold xanax for AMS, low likelihood of withdrawal given intermittent PRN use - CIWA q4 x 48 hours, given last reported dose Xanax but (+)UDS  HLD Home med - Cozaar (losartan) 100 mg daily. Will continue on admission.   Arthritis Patient reports history of arthritis. Home med - Meloxicam 7.5 mg PRN, ibuprofen 200 mg PRN.  - hold meloxicam for now, will restart if needed - hold ibuprofen while inpatient  Acid reflux Home med - Omeprazole 20 BID. Will continue while inpatient.    FEN/GI: Regular Diet Prophylaxis: Lovenox  Disposition: Med-tele  History of Present Illness:  Jasmine Berg is a 80 y.o. female presenting with altered mental status, slurred speech and weakness. Patient sleeping soundly upon entering the room, easily awoken. At first, slightly dysarthric but improved once she woke up. She reports a couple months of dizziness, falls, tinnitus. Denies LOC with falls. But does say the last two times she fell, she cannot remember the fall. She knows one of those times, she fell and broke her toe.   Reports she's here from out of town, visiting her son. She says her son told her she was dizzy and talking gibberish this morning. Last known normal was when she went to bed 9:30-10pm 03/15/21. She cannot really remember anything from this morning. Her husband told her she wasn't making sense and was like a rubber noodle. Patient does say she feels back to her  normal self. A&O x 4  Med and history card from [facility] meds from card in purse: Meloxicam 7.5 mg PRN Alprazolam 0.25 mg PRN (takes less than once per week) Possibly ibuprofen 200 mg? (hard to read handwriting on this line) Glipizide-Meformin 2.5mg -500 mg BID Omeprazole 20 BID Cozaar 100 mg daily Norvasc 5 mg  Low dose aspirin 81 mg Amitriptyline 25 mg qHS Losartan potassium 100 mg Fluoxetine 20+60 daily  Review Of Systems: Per HPI with the following additions:   Review of Systems  Constitutional:  Negative for chills and fever.  Respiratory:  Negative for chest tightness and shortness of breath.   Cardiovascular:  Negative for chest pain.  Gastrointestinal:  Positive for diarrhea (normal for her).  Neurological:  Positive for dizziness, weakness and light-headedness.  Psychiatric/Behavioral:  Positive for confusion and decreased concentration. Negative for agitation.     Patient Active Problem List   Diagnosis Date Noted   AMS (altered mental status) 03/16/2021    Past Medical History: Past Medical History:  Diagnosis Date   Diabetes mellitus without complication (HCC)    HLD (hyperlipidemia)    Hypertension     Past Surgical History: Hysterectomy 1989 GB 1996 Bladder suspension x2 2000 Carpal tunnel 2007 Transvaginal tape 2002 Cystocele Repair 2007 TVT sling 2007  Social History: Smoking - quit 30-31 years ago Smoked 10-15 years total, up to 2 PPD at one time No recreational drugs ETOH - social drinker, hardly ever drink, last drink a couple nights ago when out to dinner with family  Family History: Mom passed from  colon cancer Brother died from bladder cancer Dad died from heart history  Allergies and Medications: Lisinopril - cough Vicodin - itching   Objective: BP (!) 143/89   Pulse 76   Temp (!) 97.5 F (36.4 C) (Oral)   Resp 15   Ht 5\' 2"  (1.575 m)   Wt 64.4 kg   SpO2 91%   BMI 25.97 kg/m  Exam: General:  Well appearing, good affect, elderly, white woman, NASD Eyes: PERRL, EOM intact ENTM: Moist mucus membranes Neck: Soft, no lymphadenopathy Cardiovascular: RRR, NRMG Respiratory: CTABL Gastrointestinal: Soft, NTTP, Non-distended MSK: Gross range of motion intact in all extremities, moving all independently Derm: No rashes Neuro: AxO to person, date, city, sensation intact throughout, motor function 5/5 in lower extremities, 4/5 in upper extremities Psych: Good affect, good mood  Labs and Imaging: CBC BMET  Recent Labs  Lab 03/16/21 0720 03/16/21 0740  WBC 4.9  --   HGB 12.4 12.6  HCT 38.9 37.0  PLT 211  --    Recent Labs  Lab 03/16/21 0720 03/16/21 0740  NA 141 142  K 3.8 3.7  CL 107 106  CO2 26  --   BUN 15 18  CREATININE 0.68 0.60  GLUCOSE 95 99  CALCIUM 8.4*  --      EKG: Sinus Rhythm at 72 bpm, Qtc 481, low voltage appearance, no ST elevation  CT HEAD WITHOUT CONTRAST 03/16/21 IMPRESSION: 1. No evidence of acute intracranial abnormality. 2. Chronic appearing paranasal sinusitis.  CT ANGIOGRAPHY HEAD AND NECK 03/16/21 IMPRESSION: No large vessel occlusion, hemodynamically significant stenosis, or evidence of dissection.   3 mm left upper lobe pulmonary nodule. No follow-up needed if patient is low-risk. Non-contrast chest CT can be considered in 12 months if patient is high-risk. This recommendation follows the consensus statement: Guidelines for Management of Incidental Pulmonary Nodules Detected on CT Images: From the Fleischner Society 2017; Radiology 2017; 284:228-243.  MRI HEAD WITHOUT CONTRAST 03/16/21 IMPRESSION: No acute infarction, hemorrhage, or mass. Mild chronic microvascular ischemic changes.   MRI CERVICAL SPINE WITHOUT CONTRAST 03/16/21 IMPRESSION: Suboptimal valuation due to motion degradation. No abnormal cord signal. Multilevel degenerative changes without high-grade canal stenosis. Foraminal stenosis ranges from mild to  moderate.    13/8/22, MD 03/16/2021, 8:34 PM PGY-1, [facility] F[facility] Intern pager: [phone], text pages welcome  Upper Level Addendum: I have seen and evaluated this patient along with Dr. [phone] and reviewed the above note, making necessary revisions as appropriate. These are denoted by green text. I agree with the medical decision making and physical exam as noted above. Barbaraann Faster, MD PGY-2 [facility]

## 2021-03-16 NOTE — ED Provider Notes (Signed)
[facility] EMERGENCY DEPARTMENT Provider Note   CSN: [id number] Arrival date & time: 03/16/21  [id number]     History Chief Complaint  Patient presents with   Altered Mental Status    Jasmine Berg is a 80 y.o. female with a past medical history of diabetes, hypertension, hyperlipidemia who presents emergency department for weakness and speech change.  Patient was last known normal at 9:30 PM when she went to bed.  History is gathered by the patient, her husband, and EMS at bedside.  EMS reports that family felt like she was confused and altered this morning with slurred speech.  Patient reports that she is feeling generally weak and is having trouble walking this morning.  Her husband reports that this morning she was not even able to sit up by herself or stand and normally she can walk on her own without any assistance.  Her speech has been slurred and difficult to understand.  She has had some falls occasionally however has an abrupt change in her ability to stand and walk since last night.  Patient states that her mouth is just very dry which is why she is having difficulty speaking.  Her husband states that her speech is very altered from normal.  Patient is currently alert and oriented to person place time and event.  She denies any changes in medications.  She does not take medication for sleeping.  She denies any recent fevers, chills, cough.  She denies abdominal pain, nausea, vomiting, urinary symptoms.  She states she had a headache yesterday however it is resolved.  She denies changes in vision, unilateral weakness, history of stroke, history of cardiac disease.   Altered Mental Status     Past Medical History:  Diagnosis Date   Diabetes mellitus without complication ([place])    HLD (hyperlipidemia)    Hypertension     There are no problems to display for this patient.     OB History   No obstetric history on file.     No family history on file.     Home  Medications Prior to Admission medications   Not on File    Allergies    Patient has no allergy information on record.  Review of Systems   Review of Systems Ten systems reviewed and are negative for acute change, except as noted in the HPI.    Physical Exam Updated Vital Signs BP (!) 158/89   Pulse 76   Temp (!) 97.5 F (36.4 C) (Oral)   Resp 19   Ht 5\' 2"  (1.575 m)   Wt 64.4 kg   SpO2 100%   BMI 25.97 kg/m   Physical Exam Vitals and nursing note reviewed.  Constitutional:      General: She is not in acute distress.    Appearance: She is well-developed. She is not diaphoretic.  HENT:     Head: Normocephalic and atraumatic.     Right Ear: External ear normal.     Left Ear: External ear normal.     Nose: Nose normal.     Mouth/Throat:     Mouth: Mucous membranes are dry.  Eyes:     General: No visual field deficit or scleral icterus.    Conjunctiva/sclera: Conjunctivae normal.  Cardiovascular:     Rate and Rhythm: Normal rate and regular rhythm.     Heart sounds: Normal heart sounds. No murmur heard.   No friction rub. No gallop.  Pulmonary:     Effort: Pulmonary  effort is normal. No respiratory distress.     Breath sounds: Normal breath sounds.  Abdominal:     General: Bowel sounds are normal. There is no distension.     Palpations: Abdomen is soft. There is no mass.     Tenderness: There is no abdominal tenderness. There is no guarding.  Musculoskeletal:     Cervical back: Normal range of motion.  Skin:    General: Skin is warm and dry.  Neurological:     Mental Status: She is alert and oriented to person, place, and time.     GCS: GCS eye subscore is 4. GCS verbal subscore is 5. GCS motor subscore is 6.     Cranial Nerves: Dysarthria present. No cranial nerve deficit or facial asymmetry.     Sensory: Sensation is intact.     Motor: Weakness present. No pronator drift.     Coordination: Finger-Nose-Finger Test abnormal.     Comments: Patient Buckles  with standing, requires 2 person assist Unable to turn without stumbling Dysmetria BL w/ F-N Weakness BL upper extremities    Psychiatric:        Behavior: Behavior normal.    ED Results / Procedures / Treatments   Labs (all labs ordered are listed, but only abnormal results are displayed) Labs Reviewed  COMPREHENSIVE METABOLIC PANEL - Abnormal; Notable for the following components:      Result Value   Calcium 8.4 (*)    Total Protein 5.7 (*)    Albumin 3.1 (*)    All other components within normal limits  RAPID URINE DRUG SCREEN, HOSP PERFORMED - Abnormal; Notable for the following components:   Benzodiazepines POSITIVE (*)    All other components within normal limits  URINALYSIS, ROUTINE W REFLEX MICROSCOPIC - Abnormal; Notable for the following components:   Color, Urine STRAW (*)    All other components within normal limits  I-STAT CHEM 8, ED - Abnormal; Notable for the following components:   Calcium, Ion 1.09 (*)    All other components within normal limits  RESP PANEL BY RT-PCR (FLU A&B, COVID) ARPGX2  CULTURE, BLOOD (ROUTINE X 2)  CULTURE, BLOOD (ROUTINE X 2)  ETHANOL  PROTIME-INR  APTT  CBC WITH DIFFERENTIAL/PLATELET  AMMONIA  LACTIC ACID, PLASMA  TSH    EKG EKG Interpretation  Date/Time:  Tuesday March 16 2021 11:57:07 EST Ventricular Rate:  72 PR Interval:  197 QRS Duration: 98 QT Interval:  439 QTC Calculation: 481 R Axis:   -28 Text Interpretation: Sinus rhythm Inferior infarct, old No previous tracing Confirmed by Blanchie Dessert [phone]) on 03/16/2021 1:01:26 PM  Radiology CT ANGIO HEAD NECK W WO CM  Result Date: 03/16/2021 CLINICAL DATA:  Neuro deficit, acute, stroke suspected EXAM: CT ANGIOGRAPHY HEAD AND NECK TECHNIQUE: Multidetector CT imaging of the head and neck was performed using the standard protocol during bolus administration of intravenous contrast. Multiplanar CT image reconstructions and MIPs were obtained to evaluate the vascular  anatomy. Carotid stenosis measurements (when applicable) are obtained utilizing NASCET criteria, using the distal internal carotid diameter as the denominator. CONTRAST:  70mL OMNIPAQUE IOHEXOL 350 MG/ML SOLN COMPARISON:  None. FINDINGS: CTA NECK Aortic arch: Minimal calcified plaque along the arch. Patent great vessel origins. Right carotid system: Patent. No stenosis. Partially retropharyngeal course. Left carotid system: Patent. Trace calcified plaque along the proximal internal carotid. No stenosis. Vertebral arteries: Patent.  Codominant.  No stenosis. Skeleton: Cervical spine degenerative changes. Other neck: Unremarkable. Upper chest: 3 mm nodule of  the left upper lobe (series 5, image 165). Review of the MIP images confirms the above findings CTA HEAD Anterior circulation: Intracranial internal carotid arteries are patent with mild calcified plaque. Anterior and middle cerebral arteries are patent. Posterior circulation: Intracranial vertebral arteries are patent. Basilar artery is patent. Major cerebellar artery origins are patent. Posterior cerebral arteries are patent. Venous sinuses: Patent as allowed by contrast bolus timing. Review of the MIP images confirms the above findings IMPRESSION: No large vessel occlusion, hemodynamically significant stenosis, or evidence of dissection. 3 mm left upper lobe pulmonary nodule. No follow-up needed if patient is low-risk. Non-contrast chest CT can be considered in 12 months if patient is high-risk. This recommendation follows the consensus statement: Guidelines for Management of Incidental Pulmonary Nodules Detected on CT Images: From the Fleischner Society 2017; Radiology 2017; 284:228-243. Electronically Signed   By: Guadlupe Spanish M.D.   On: 03/16/2021 11:00   CT HEAD WO CONTRAST  Result Date: 03/16/2021 CLINICAL DATA:  Mental status change, unknown cause EXAM: CT HEAD WITHOUT CONTRAST TECHNIQUE: Contiguous axial images were obtained from the base of the  skull through the vertex without intravenous contrast. COMPARISON:  None. FINDINGS: Brain: No evidence of parenchymal hemorrhage or extra-axial fluid collection. No mass lesion, mass effect, or midline shift. No CT evidence of acute infarction. Cerebral volume is age appropriate. No ventriculomegaly. Vascular: No acute abnormality. Skull: No evidence of calvarial fracture. Sinuses/Orbits: Complete opacification of the visualized left maxillary sinus. Mucoperiosteal thickening in the left greater than right ethmoidal air cells. No fluid levels. Other:  The mastoid air cells are unopacified. IMPRESSION: 1. No evidence of acute intracranial abnormality. 2. Chronic appearing paranasal sinusitis. Electronically Signed   By: Delbert Phenix M.D.   On: 03/16/2021 08:39   MR BRAIN WO CONTRAST  Result Date: 03/16/2021 CLINICAL DATA:  Neuro deficit, acute, stroke suspected EXAM: MRI HEAD WITHOUT CONTRAST TECHNIQUE: Multiplanar, multiecho pulse sequences of the brain and surrounding structures were obtained without intravenous contrast. COMPARISON:  None. FINDINGS: Brain: There is no acute infarction or intracranial hemorrhage. There is no intracranial mass, mass effect, or edema. There is no hydrocephalus or extra-axial fluid collection. Prominence of the ventricles and sulci reflects parenchymal volume loss. Patchy T2 hyperintensity in the supratentorial white matter is nonspecific but may reflect mild chronic microvascular ischemic changes. Vascular: Major vessel flow voids at the skull base are preserved. Skull and upper cervical spine: Normal marrow signal is preserved. Cervical spine degenerative changes. Sinuses/Orbits: Opacified left maxillary anterior ethmoid sinuses and. Patchy mucosal thickening elsewhere. Bilateral lens replacements. Other: Sella is unremarkable.  Mastoid air cells are clear. IMPRESSION: No acute infarction, hemorrhage, or mass. Mild chronic microvascular ischemic changes. Electronically Signed    By: Guadlupe Spanish M.D.   On: 03/16/2021 13:47   MR Cervical Spine Wo Contrast  Result Date: 03/16/2021 CLINICAL DATA:  Motor neuron disease EXAM: MRI CERVICAL SPINE WITHOUT CONTRAST TECHNIQUE: Multiplanar, multisequence MR imaging of the cervical spine was performed. No intravenous contrast was administered. COMPARISON:  None. FINDINGS: Motion artifact is present. Alignment: Mild retrolisthesis at C4-C5 and C5-C6. Vertebrae: Degenerative endplate irregularity, greatest at C4-C5 and C5-C6. Vertebral body heights are otherwise maintained. There is no substantial marrow edema. Cord: No abnormal signal. Posterior Fossa, vertebral arteries, paraspinal tissues: Unremarkable. Disc levels: C2-C3: Facet hypertrophy.  No canal or foraminal stenosis. C3-C4: Minimal disc bulge. Facet hypertrophy. No canal or foraminal stenosis. C4-C5: Disc bulge with endplate osteophytes. Facet and uncovertebral hypertrophy. No canal or left foraminal stenosis. Mild  right foraminal stenosis. C5-C6: Disc bulge with endplate osteophytes. Uncovertebral and facet hypertrophy. Mild canal stenosis. Mild right and moderate left foraminal stenosis. C6-C7: Disc bulge with endplate osteophytes. Uncovertebral hypertrophy. No canal or foraminal stenosis. C7-T1: Facet hypertrophy. No canal stenosis. Mild foraminal stenosis. IMPRESSION: Suboptimal valuation due to motion degradation. No abnormal cord signal. Multilevel degenerative changes without high-grade canal stenosis. Foraminal stenosis ranges from mild to moderate. Electronically Signed   By: Macy Mis M.D.   On: 03/16/2021 14:25    Procedures Procedures   Medications Ordered in ED Medications  sodium chloride 0.9 % bolus 1,000 mL (0 mLs Intravenous Stopped 03/16/21 0857)  acetaminophen (TYLENOL) tablet 650 mg (650 mg Oral Given 03/16/21 1139)  iohexol (OMNIPAQUE) 350 MG/ML injection 75 mL (75 mLs Intravenous Contrast Given 03/16/21 1046)    ED Course  I have reviewed the triage  vital signs and the nursing notes.  Pertinent labs & imaging results that were available during my care of the patient were reviewed by me and considered in my medical decision making (see chart for details).  Clinical Course as of 03/16/21 1527  Tue Mar 16, 2021  1021 I reevaluated the patient.  Her speech has improved significantly.  She is very sleepy.  She states she takes Xanax for when she is "shaky" and last used it at 10 PM last night.  She denies any use this morning.  On exam the patient also has significant dysmetria which is improved from before bilaterally with finger-to-nose and is extremely ataxic and unable to walk or stand without assistance [AH]  1022 I discussed all findings with Dr. Curly Shores who recommends CT angiogram head and neck as well as MRI head and C-spine [AH]  1405 Reevaluated the patient's neurologic examination.  She is steadily improving however again demonstrates dysmetria.  She was able to stand again with two-person assist however her balance is very poor and required help.  Her speech is baseline now.  She is far less somnolent. [AH]  1523 Case discussed with Dr. Curly Shores- we have reviewed MRI images together.she will formally consult.  [AH]    Clinical Course User Index [AH] Margarita Mail, PA-C   MDM Rules/Calculators/A&P                         Patient here with weakness. The differential diagnosis of weakness includes but is not limited to neurologic causes (GBS, myasthenia gravis, CVA, MS, ALS, transverse myelitis, spinal cord injury, CVA, botulism, ) and other causes: ACS, Arrhythmia, syncope, orthostatic hypotension, sepsis, hypoglycemia, electrolyte disturbance, hypothyroidism, respiratory failure, symptomatic anemia, dehydration, heat injury, polypharmacy, malignancy. I ordered and reviewed labs including CBC ammonia and lactate PT/INR, APTT, ethanol, urinalysis, TSH all within normal limits. CMP with mildly low calcium and protein levels. UDS positive  for benzodiazepines.  I ordered and reviewed images including CT head, CT angio head and neck with and without contrast media, MRA brain without contrast media and MR C-spine without contrast media.  There are no acute findings on any of these images.  EKG shows sinus rhythm at a rate of 72.  Patient evaluated multiple times as discussed in the ED course.  Though she is improving she is still very unsteady on her feet with weakness of the upper extremities and dysmetria.  I have asked Dr. Curly Shores to consult formally on the patient.  Currently awaiting her evaluation.  Signout given to PA Sanders at shift handoff Final Clinical Impression(s) / ED Diagnoses  Final diagnoses:  None    Rx / DC Orders ED Discharge Orders     None        Margarita Mail, PA-C 03/16/21 [place], MD 03/22/21 [phone]

## 2021-03-17 ENCOUNTER — Inpatient Hospital Stay (HOSPITAL_BASED_OUTPATIENT_CLINIC_OR_DEPARTMENT_OTHER): Payer: Medicare HMO

## 2021-03-17 DIAGNOSIS — R278 Other lack of coordination: Secondary | ICD-10-CM

## 2021-03-17 DIAGNOSIS — R339 Retention of urine, unspecified: Secondary | ICD-10-CM | POA: Diagnosis not present

## 2021-03-17 DIAGNOSIS — G459 Transient cerebral ischemic attack, unspecified: Secondary | ICD-10-CM

## 2021-03-17 DIAGNOSIS — I10 Essential (primary) hypertension: Secondary | ICD-10-CM | POA: Diagnosis not present

## 2021-03-17 DIAGNOSIS — R4182 Altered mental status, unspecified: Secondary | ICD-10-CM | POA: Diagnosis not present

## 2021-03-17 LAB — VITAMIN B12: Vitamin B-12: 324 pg/mL (ref 180–914)

## 2021-03-17 LAB — BASIC METABOLIC PANEL
Anion gap: 9 (ref 5–15)
BUN: 14 mg/dL (ref 8–23)
CO2: 29 mmol/L (ref 22–32)
Calcium: 8.9 mg/dL (ref 8.9–10.3)
Chloride: 101 mmol/L (ref 98–111)
Creatinine, Ser: 0.75 mg/dL (ref 0.44–1.00)
GFR, Estimated: >60 mL/min (ref >60)
Glucose, Bld: 106 mg/dL — ABNORMAL HIGH (ref 70–99)
Potassium: 3.8 mmol/L (ref 3.5–5.1)
Sodium: 139 mmol/L (ref 135–145)

## 2021-03-17 LAB — HEMOGLOBIN A1C
Hgb A1c MFr Bld: 6.9 % — ABNORMAL HIGH (ref 4.8–5.6)
Mean Plasma Glucose: 151.33 mg/dL

## 2021-03-17 LAB — ECHOCARDIOGRAM COMPLETE
Height: 62 in
S' Lateral: 2.3 cm
Weight: 2272 oz

## 2021-03-17 LAB — GLUCOSE, CAPILLARY
Glucose-Capillary: 95 mg/dL (ref 70–99)
Glucose-Capillary: 125 mg/dL — ABNORMAL HIGH (ref 70–99)
Glucose-Capillary: 164 mg/dL — ABNORMAL HIGH (ref 70–99)

## 2021-03-17 LAB — RPR: RPR Ser Ql: NONREACTIVE

## 2021-03-17 LAB — FOLATE: Folate: 12.9 ng/mL (ref >5.9)

## 2021-03-17 NOTE — Progress Notes (Addendum)
Family Medicine Teaching Service Daily Progress Note Intern Pager: [phone]  Patient name: Jasmine Berg Medical record number: [id number] Date of birth: 1940/05/13 Age: 80 y.o. Gender: female  Primary Care Provider: Pcp, No Consultants: Neurology, urology Code Status: Full  Pt Overview and Major Events to Date:  11/8: Admitted  Assessment and Plan: Patient is a 80 year old female presenting with weakness, dysarthria and altered mental status. PMH of T2DM, HTN, HLD, arthritis, acid reflux, and urinary incontinence.  Weakness  AMS Presented to the ED with weakness and speech changes, by the time of admission to FPTS, patient return to baseline mental status and strength.  CT head and MRI cervical spine WNL.   DDX: TIA vs. 2/2 urinary retention vs. Polypharmacy/medication-induced - Neurology following, appreciate recommendations - Awaiting blood cultures - Will see if can obtain earlier neuro appointment with provider in [place]  Urinary Retention  Hx of Urinary incontinence Difficulty voiding 2-3 times a week, usually at night.  Patient straight cath 800 cc void admission. Most likely medication induced (amitriptyline and xanax)  - Bladder scan every 8 hours x36 hours (0300 scan showed 497 mL retained; patient was catheterized, output 600 cc) - Continue straight cath as needed urinary retention - Urology consult call placed for appropriateness to go home with foley; Dr. Laverle Patter to return call. Addendum: Call returned at 1140- Urology will place foley and advise follow-up closer to home for management.  will advise patient not to travel immediately after discharge. - Taper home amitriptyline to 37.5 mg for discharge and advise discontinuation of Xanax. Urology agrees with this plan.   Hypoalbuminemia Albumin 3.1, likely due to malnutrition - RD consult placed  HTN 24-hour BP 140s- 180s/ 70s-90s. - Once home medications verified, consider restarting home losartan 100 mg and Norvasc  5 mg  T2DM A.m. CBG 95 - Continue CBGs 4 times daily - Continue sSSI - Hold home glipizide-metformin 2.5-500 mg twice daily  Depression, Insomnia  benzodiazepine use - Hold home amitriptyline 75 mg nightly, fluoxetine 20 mg every morning and 60 mg nightly, and Xanax 0.125 mg for AMS - CIWA protocol every 4 hours x48 hours, given last reported dose of Xanax almost 72 hours ago, but UDS positive for BZD  GERD Chronic, stable - Once home meds verified, consider restarting home omeprazole 20 mg twice daily  FEN/GI: Regular PPx: Lovenox Dispo:Home, pending clinical improvement  Subjective:  Patient reports doing well this AM. A&O x 4. No focal deficits reported.  Objective: Temp:  [98 F (36.7 C)-98.5 F (36.9 C)] 98.4 F (36.9 C) (11/09 0310) Pulse Rate:  [65-85] 76 (11/09 0615) Resp:  [12-27] 16 (11/09 0615) BP: (130-181)/(64-126) 147/78 (11/09 0615) SpO2:  [91 %-100 %] 95 % (11/09 0615) Physical Exam: General: Elderly age-congruent appearing female in no acute distress Cardiovascular: RRR, no murmurs nor abnormal heart sounds appreciated Respiratory: CTAB, normal effort on room air Abdomen: Soft, NT, with mild bladder distention appreciated Extremities: Non-edematous LE; SCDs in place Neurological: A&O x 4, strength 4+ in BUE.  Psychiatric: Normal mood and affect  Laboratory: Recent Labs  Lab 03/16/21 0720 03/16/21 0740  WBC 4.9  --   HGB 12.4 12.6  HCT 38.9 37.0  PLT 211  --    Recent Labs  Lab 03/16/21 0720 03/16/21 0740 03/17/21 0337  NA 141 142 139  K 3.8 3.7 3.8  CL 107 106 101  CO2 26  --  29  BUN 15 18 14   CREATININE 0.68 0.60 0.75  CALCIUM 8.4*  --  8.9  PROT 5.7*  --   --   BILITOT 0.5  --   --   ALKPHOS 69  --   --   ALT 22  --   --   AST 24  --   --   GLUCOSE 95 99 106*   Vitamin B12 and folate WNL RPR NR  MMA, Ceruloplasmin, B1, Vit E, Homocysteine, Heavy metals, Copper, ANA pending  Imaging/Diagnostic Tests: CT  Head IMPRESSION: 1. No evidence of acute intracranial abnormality. 2. Chronic appearing paranasal sinusitis.   Electronically Signed   By: Ilona Sorrel M.D.   On: 03/16/2021 08:39  CT Angio Head and Neck IMPRESSION: No large vessel occlusion, hemodynamically significant stenosis, or evidence of dissection.   3 mm left upper lobe pulmonary nodule. No follow-up needed if patient is low-risk. Non-contrast chest CT can be considered in 12 months if patient is high-risk. This recommendation follows the consensus statement: Guidelines for Management of Incidental Pulmonary Nodules Detected on CT Images: From the Fleischner Society 2017; Radiology 2017; 284:228-243.   Electronically Signed   By: Macy Mis M.D.   On: 03/16/2021 11:00  MR Brain w/o Contrast IMPRESSION: No acute infarction, hemorrhage, or mass. Mild chronic microvascular ischemic changes.   Electronically Signed   By: Macy Mis M.D.   On: 03/16/2021 13:47  MR Cervical Spine w/o Contrast IMPRESSION: Suboptimal valuation due to motion degradation. No abnormal cord signal. Multilevel degenerative changes without high-grade canal stenosis. Foraminal stenosis ranges from mild to moderate.   Electronically Signed   By: Macy Mis M.D.   On: 03/16/2021 14:25   Rosezetta Schlatter, MD 03/17/2021, 7:10 AM PGY-1, [place] Intern pager: [phone], text pages welcome

## 2021-03-17 NOTE — Evaluation (Signed)
Physical Therapy Evaluation Patient Details Name: Jasmine Berg MRN: [id number] DOB: 1940/12/08 Today's Date: 03/17/2021  History of Present Illness  80 y.o. F admitted on 11/8 due to slured speech and weakness. CT and MRI are negative. PMH significant for DM2, HTN, HLD, arthritis, acid reflux, and urinary incontinence.  Clinical Impression  Pt presents to PT with chronic deficits in balance, no significant acute deficits are observed at this time. Pt demonstrates one loss of balance when performing vertical head turns but otherwise tolerates multiple dynamic gait challenges well. PT recommends frequent mobilization during this admission. PT recommends no PT or DME needs at the time of discharge.       Recommendations for follow up therapy are one component of a multi-disciplinary discharge planning process, led by the attending physician.  Recommendations may be updated based on patient status, additional functional criteria and insurance authorization.  Follow Up Recommendations No PT follow up    Assistance Recommended at Discharge    Functional Status Assessment Patient has not had a recent decline in their functional status  Equipment Recommendations  None recommended by PT    Recommendations for Other Services       Precautions / Restrictions Precautions Precautions: Fall (pt with chronic balance deficits) Restrictions Weight Bearing Restrictions: No      Mobility  Bed Mobility Overal bed mobility: Independent             General bed mobility comments: sit to supine independently    Transfers Overall transfer level: Independent                      Ambulation/Gait Ambulation/Gait assistance: Supervision Gait Distance (Feet): 300 Feet Assistive device: None Gait Pattern/deviations: Step-through pattern Gait velocity: functional Gait velocity interpretation: >2.62 ft/sec, indicative of community ambulatory   General Gait Details: pt with one loss of  balance with looking down to ground. Pt is able to self-correct. Ot otherwise tolerates horizontal head turns, changes of gait speed, abrupt stoppages of gait and quick turns, all without loss of balance  Stairs Stairs: Yes Stairs assistance: Modified independent (Device/Increase time) Stair Management: One rail Right;Alternating pattern Number of Stairs: 3    Wheelchair Mobility    Modified Rankin (Stroke Patients Only)       Balance Overall balance assessment: Mild deficits observed, not formally tested                                           Pertinent Vitals/Pain Pain Assessment: No/denies pain    Home Living Family/patient expects to be discharged to:: Private residence Living Arrangements: Spouse/significant other Available Help at Discharge: Family Type of Home: Apartment Home Access: Stairs to enter Entrance Stairs-Rails: None Entrance Stairs-Number of Steps: 2-3 steps Alternate Level Stairs-Number of Steps: flight Home Layout: Two level;Able to live on main level with bedroom/bathroom Home Equipment: Gilmer Mor - single point      Prior Function Prior Level of Function : Independent/Modified Independent;Driving;History of Falls (last six months)             Mobility Comments: pt reports independence in mobility, enjoys shopping ADLs Comments: Independent     Hand Dominance   Dominant Hand: Right    Extremity/Trunk Assessment   Upper Extremity Assessment Upper Extremity Assessment: Overall WFL for tasks assessed    Lower Extremity Assessment Lower Extremity Assessment: Overall WFL for tasks assessed  Cervical / Trunk Assessment Cervical / Trunk Assessment: Normal  Communication   Communication: No difficulties  Cognition Arousal/Alertness: Awake/alert Behavior During Therapy: WFL for tasks assessed/performed Overall Cognitive Status: Within Functional Limits for tasks assessed                                           General Comments General comments (skin integrity, edema, etc.): VSS on RA    Exercises     Assessment/Plan    PT Assessment Patient needs continued PT services  PT Problem List Decreased balance       PT Treatment Interventions Gait training;Balance training;Neuromuscular re-education;Therapeutic activities    PT Goals (Current goals can be found in the Care Plan section)  Acute Rehab PT Goals Patient Stated Goal: to go home PT Goal Formulation: With patient/family Time For Goal Achievement: 03/31/21 Potential to Achieve Goals: Good Additional Goals Additional Goal #1: Pt will score >19/24 on DGI to indicate a reduced risk for falls    Frequency Min 4X/week   Barriers to discharge        Co-evaluation               AM-PAC PT "6 Clicks" Mobility  Outcome Measure Help needed turning from your back to your side while in a flat bed without using bedrails?: None Help needed moving from lying on your back to sitting on the side of a flat bed without using bedrails?: None Help needed moving to and from a bed to a chair (including a wheelchair)?: None Help needed standing up from a chair using your arms (e.g., wheelchair or bedside chair)?: None Help needed to walk in hospital room?: A Little Help needed climbing 3-5 steps with a railing? : None 6 Click Score: 23    End of Session   Activity Tolerance: Patient tolerated treatment well Patient left: in bed;with call bell/phone within reach;with family/visitor present Nurse Communication: Mobility status PT Visit Diagnosis: History of falling (Z91.81)    Time: 0600-4599 PT Time Calculation (min) (ACUTE ONLY): 12 min   Charges:   PT Evaluation $PT Eval Low Complexity: 1 Low          Arlyss Gandy, PT, DPT Acute Rehabilitation Pager: [phone] Office [phone]   Arlyss Gandy 03/17/2021, 1:44 PM

## 2021-03-17 NOTE — Discharge Instructions (Addendum)
Dear Jasmine Berg,   Thank you so much for allowing [place] to be part of your care!  You were admitted to [facility] for weakness and altered mental status and we are glad you are feeling better. We do recommend following up with urology outpatient as well as with your primary doctor. We recommend limiting the use of Xanax as that can cause increased symptoms. Please be sure to make it to your Neurology follow up appointment outpatient on Jan 3rd. Recommend starting PO Multivitamin with Multi-mineral.   RETURN PRECAUTIONS: Return for worsening weakness, or new and concerning symptoms.   Take care and be well!  Family Medicine Teaching Service Inpatient Team [place]  [facility]  [address] [place], [place] [address] [phone]

## 2021-03-17 NOTE — Plan of Care (Signed)

## 2021-03-17 NOTE — Progress Notes (Signed)
   03/17/21 1115  Clinical Encounter Type  Visited With Patient  Visit Type Initial  Referral From Nurse  Consult/Referral To Chaplain   Chaplain Tery Sanfilippo responded to the consult request for an Advance Directive. Chaplain provided A.D. education. The patient said she wants her husband, [name], to be her HCPOA. The chaplain advised the patient since she is legally married, her husband, [name], will be designated by law to speak on her behalf if she becomes incapacitated. Chaplain Manson Passey advised HCPOA document can be done if she chooses someone other than her husband by completing an A.D. This note was prepared by Deneen Harts, M.Div..  For questions please contact by phone [phone].

## 2021-03-17 NOTE — Evaluation (Signed)
Occupational Therapy Evaluation Patient Details Name: Jasmine Berg MRN: [id number] DOB: 06-05-1940 Today's Date: 03/17/2021   History of Present Illness 80 y.o. F admitted on 11/8 due to slured speech and weakness. CT and MRI are negative. PMH significant for DM2, HTN, HLD, arthritis, acid reflux, and urinary incontinence.   Clinical Impression   Pt admitted for concerns listed above. PTA Pt reported that she was independent with all ADL's and IADL's including driving, however pt has been reporting some short term memory deficits. At this time, pt continues to demonstrate independence with all ADL's and functional mobility with no overt balance concerns.  Pt educated to follow up with her PCP once home to address short term memory concerns. She has no further OT needs at this time and acute OT will sign off.      Recommendations for follow up therapy are one component of a multi-disciplinary discharge planning process, led by the attending physician.  Recommendations may be updated based on patient status, additional functional criteria and insurance authorization.   Follow Up Recommendations  No OT follow up    Assistance Recommended at Discharge PRN  Functional Status Assessment  Patient has not had a recent decline in their functional status  Equipment Recommendations  None recommended by OT    Recommendations for Other Services       Precautions / Restrictions Precautions Precautions: Fall Restrictions Weight Bearing Restrictions: No      Mobility Bed Mobility Overal bed mobility: Modified Independent             General bed mobility comments: use of bed rails    Transfers Overall transfer level: Modified independent Equipment used: None               General transfer comment: Increased time to steady initially      Balance Overall balance assessment: Mild deficits observed, not formally tested                                          ADL either performed or assessed with clinical judgement   ADL Overall ADL's : Modified independent;At baseline                                       General ADL Comments: Overall pt at mod I level with all ADL's, toileted with no difficulties, ROM functional for dressing and grooming with no difficulties, sitting and standing.     Vision Baseline Vision/History: 1 Wears glasses Ability to See in Adequate Light: 0 Adequate Patient Visual Report: No change from baseline Vision Assessment?: No apparent visual deficits     Perception     Praxis      Pertinent Vitals/Pain Pain Assessment: No/denies pain     Hand Dominance Right   Extremity/Trunk Assessment Upper Extremity Assessment Upper Extremity Assessment: Overall WFL for tasks assessed   Lower Extremity Assessment Lower Extremity Assessment: Defer to PT evaluation   Cervical / Trunk Assessment Cervical / Trunk Assessment: Normal   Communication Communication Communication: No difficulties   Cognition Arousal/Alertness: Awake/alert Behavior During Therapy: WFL for tasks assessed/performed Overall Cognitive Status: Impaired/Different from baseline Area of Impairment: Memory;Safety/judgement                     Memory: Decreased short-term memory  Safety/Judgement: Decreased awareness of safety     General Comments: Pt made several comments of not remembering things that have happened in the past few weeks, additionally, pt could not remember the name of the hospital, however she is from [place], only visiting her son.     General Comments  VSS on RA, no concerns    Exercises     Shoulder Instructions      Home Living Family/patient expects to be discharged to:: Private residence Living Arrangements: Spouse/significant other Available Help at Discharge: Family Type of Home: Apartment Home Access: Stairs to enter [employer] of Steps: 2-3 steps Entrance  Stairs-Rails: None Home Layout: Two level;Able to live on main level with bedroom/bathroom     Bathroom Shower/Tub: Producer, television/film/video: Standard Bathroom Accessibility: Yes How Accessible: Accessible via walker Home Equipment: Cane - single point          Prior Functioning/Environment Prior Level of Function : Independent/Modified Independent;Driving;History of Falls (last six months)             Mobility Comments: Pt reports independent using no DME, however she also reports multiple falls in the past 6 months due to balance concerns. ADLs Comments: Independent        OT Problem List: Decreased strength;Decreased activity tolerance;Impaired balance (sitting and/or standing);Decreased safety awareness;Decreased cognition      OT Treatment/Interventions:      OT Goals(Current goals can be found in the care plan section) Acute Rehab OT Goals Patient Stated Goal: To go home OT Goal Formulation: All assessment and education complete, DC therapy Time For Goal Achievement: 03/17/21 Potential to Achieve Goals: Good  OT Frequency:     Barriers to D/C:            Co-evaluation              AM-PAC OT "6 Clicks" Daily Activity     Outcome Measure Help from another person eating meals?: None Help from another person taking care of personal grooming?: None Help from another person toileting, which includes using toliet, bedpan, or urinal?: None Help from another person bathing (including washing, rinsing, drying)?: None Help from another person to put on and taking off regular upper body clothing?: None Help from another person to put on and taking off regular lower body clothing?: None 6 Click Score: 24   End of Session Equipment Utilized During Treatment: Gait belt Nurse Communication: Mobility status  Activity Tolerance: Patient tolerated treatment well Patient left: in bed;with call bell/phone within reach  OT Visit Diagnosis: Unsteadiness on  feet (R26.81);Other abnormalities of gait and mobility (R26.89);Muscle weakness (generalized) (M62.81)                Time: 5638-7564 OT Time Calculation (min): 25 min Charges:  OT General Charges $OT Visit: 1 Visit OT Evaluation $OT Eval Low Complexity: 1 Low OT Treatments $Self Care/Home Management : 8-22 mins  Daemion Mcniel H., OTR/L Acute Rehabilitation  Yemariam Magar Elane Tangala Wiegert 03/17/2021, 9:25 AM

## 2021-03-17 NOTE — Care Management Obs Status (Signed)
MEDICARE OBSERVATION STATUS NOTIFICATION   Patient Details  Name: Jasmine Berg MRN: [id number] Date of Birth: 1941/01/04   Medicare Observation Status Notification Given:  Yes    Lorri Frederick, LCSW 03/17/2021, 3:35 PM

## 2021-03-17 NOTE — Plan of Care (Signed)
Pt is alert oriented x4. Pt has weakness to bilateral lower legs, but is ambulatory with stand by assist.  Pt denies numbness or tingling. Pt has had urinary retention x 2. Bladder scan showed 497. Family medicine provider paged and received order fro I& cath, out put was . Pt resting no distress noted.  Problem: Education: Goal: Knowledge of General Education information will improve Description: Including pain rating scale, medication(s)/side effects and non-pharmacologic comfort measures Outcome: Progressing   Problem: Health Behavior/Discharge Planning: Goal: Ability to manage health-related needs will improve Outcome: Progressing   Problem: Clinical Measurements: Goal: Ability to maintain clinical measurements within normal limits will improve Outcome: Progressing Goal: Will remain free from infection Outcome: Progressing Goal: Diagnostic test results will improve Outcome: Progressing Goal: Respiratory complications will improve Outcome: Progressing Goal: Cardiovascular complication will be avoided Outcome: Progressing   Problem: Nutrition: Goal: Adequate nutrition will be maintained Outcome: Progressing   Problem: Coping: Goal: Level of anxiety will decrease Outcome: Progressing   Problem: Elimination: Goal: Will not experience complications related to bowel motility Outcome: Progressing Goal: Will not experience complications related to urinary retention Outcome: Progressing   Problem: Pain Managment: Goal: General experience of comfort will improve Outcome: Progressing   Problem: Safety: Goal: Ability to remain free from injury will improve Outcome: Progressing   Problem: Skin Integrity: Goal: Risk for impaired skin integrity will decrease Outcome: Progressing

## 2021-03-17 NOTE — Progress Notes (Signed)
  Echocardiogram 2D Echocardiogram has been performed.  Delcie Roch 03/17/2021, 5:33 PM

## 2021-03-17 NOTE — H&P (Signed)
Family Medicine Teaching [facility] Admission History and Physical Service Pager: [phone]   Patient name: Jasmine Berg    Medical record number: [id number] Date of birth: 1941-03-06        Age: 80 y.o.    Gender: female   Primary Care Provider:  Julian Reil, MD in [facility], [place] Consultants: Neurology Code Status: Full code  Preferred Emergency Contact: Husband Kasey Hansell [phone]   Chief Complaint: weakness and AMS   Assessment and Plan: Jasmine Berg is a 80 y.o. female presenting with weakness and speech changes . PMH is significant for T2DM, HTN, HLD, arthritis, acid reflux, urinary incontinence.    Weakness and AMS Patient presented to the ED with weakness and speech changes; last known normal was 9:30 pm 03/15/21. This morning upon waking, family felt she was weak and not speaking clearly, so brought her in. Upon presentation to ED, she was moderately hypertensive with otherwise stable vital signs. ED provider found her to be dysarthric, have abnormal finger-to-nose, and unable to stand without assistance. Labs notable only for UDS positive or benzos. She had a CMP that was unremarkable when calcium corrected for albumin, CBC, NH4, Lactic acid, and ethanol levels where all wnl. UDS was positive for benzodiazepines and UA was wnl.  EKG was sinus rhythm. Head imaging negative for acute processes including stroke. CT Angio head and neck showed no large vessel occlusion, hemodynamically significant stenosis or evidence of dissection, with 3 mm left upper lobe pulmonary nodule. MR brain showed no acute infarction, hemorrhage, or mass, with mild chronic microvascular ischemic changes. CT head showed no evidence of acute intracranial abnormality. MR cervical spine showed no abnormal cord signal, with multilevel degenerative changes, without high-grade canal stenosis. On admission, her vitals were stable, on physical exam she was alert and oriented to self/place/date, with  regular speech and fluidity. Strength was 5/5 in LE and 4/5 in upper extremities. Differential remains broad at this time, but includes CVA vs TIA, polypharmacy, electrolyte abnormality such as hypokalemia, hypoglycemia, and infection. Etiology of the AMS not 2/2 to CVA given imaging, could be TIA given resolution of symptoms and return to baseline with mental status. Etiology could be 2/2 polypharmacy given positive UDS for benzodiazepines, and return to baseline. CMP and fluid status were wnl, less concerning for an electrolyte abnormality. Given normal CBC and resolution of symptoms, less concerned for infectious etiology. Unlikely hypoglycemia, as patient was euglycemic on presentation and resolved withou administration of dextrose; however she does take glipizide for T2DM so possible accidental extra ingestion. Neurology consulted by ED provider, will see her as soon as possible.  -Admit to med-tele with FPTS, Dr. Burley Saver attending -Appreciate neurology recommendations -Awaiting blood cultures -Vitals per unit routine -continuous cardiac monitoring -OT/PT eval and treat -Continuous Pulse Ox -Regular diet   Urinary Retention  Hx urinary incontinence  Patient unable to void at admission. Straight cath 800 cc void. Patient reports history of difficulty voiding 2-3 times a week, usually at night, that self-resolves. Patient has notable surgical history related to urinary incontinence including Bladder suspension 2000, Transvaginal tape 2002 and 2007, Cystocele Repair 2007. - bladder scan q8 hours x 36 hours - may consider straight cath PRN for urinary retention   Hypoalbuminemia Albumin 3.1 on admission, likely some component of mild to moderate malnutrition.  - consult dietician   HTN Hypertensive on admission with systolics 150s-180s, diastolics 70s-90s. Stroke r/o with head imaging. Neuro to see. Home meds - Losartan potassium 100 mg, Norvasc 5  mg, low dose aspirin 81 mg.  - continue  losartan - continue amlodipine   T2DM Patient reports she takes Glipizide-Meformin 2.5mg -500 mg BID.  - CBGs QID - sSSI - hold glipizide and metformin   Mental Health  Benzodiazepine use Home meds - Amitriptyline 25 mg qHS, fluoxetine 20+60 (total of 80 mg) daily, Alprazolam 0.25 mg PRN (takes less than once per week). Patient unsure of exact reason for meds. Does say xanax helps her to sleep. Of note, UDS on admission positive for benzos, but patient remembers last doze xanax was Sunday.  - continue amitriptyline - continue fluoxetine - hold xanax for AMS, low likelihood of withdrawal given intermittent PRN use - CIWA q4 x 48 hours, given last reported dose Xanax but (+)UDS   HLD Home med - Cozaar (losartan) 100 mg daily. Will continue on admission.    Arthritis Patient reports history of arthritis. Home med - Meloxicam 7.5 mg PRN, ibuprofen 200 mg PRN.  - hold meloxicam for now, will restart if needed - hold ibuprofen while inpatient   Acid reflux Home med - Omeprazole 20 BID. Will continue while inpatient.      FEN/GI: Regular Diet Prophylaxis: Lovenox   Disposition: Med-tele   History of Present Illness:  Jasmine Berg is a 80 y.o. female presenting with altered mental status, slurred speech and weakness. Patient sleeping soundly upon entering the room, easily awoken. At first, slightly dysarthric but improved once she woke up. She reports a couple months of dizziness, falls, tinnitus. Denies LOC with falls. But does say the last two times she fell, she cannot remember the fall. She knows one of those times, she fell and broke her toe.    Reports she's here from out of town, visiting her son. She says her son told her she was dizzy and talking gibberish this morning. Last known normal was when she went to bed 9:30-10pm 03/15/21. She cannot really remember anything from this morning. Her husband told her she wasn't making sense and was like a rubber noodle. Patient does say she  feels back to her normal self. A&O x 4   Med and history card from [facility] in [facility] meds from card in purse: Meloxicam 7.5 mg PRN Alprazolam 0.25 mg PRN (takes less than once per week) Possibly ibuprofen 200 mg? (hard to read handwriting on this line) Glipizide-Meformin 2.5mg -500 mg BID Omeprazole 20 BID Cozaar 100 mg daily Norvasc 5 mg  Low dose aspirin 81 mg Amitriptyline 25 mg qHS Losartan potassium 100 mg Fluoxetine 20+60 daily   Review Of Systems: Per HPI with the following additions:    Review of Systems  Constitutional:  Negative for chills and fever.  Respiratory:  Negative for chest tightness and shortness of breath.   Cardiovascular:  Negative for chest pain.  Gastrointestinal:  Positive for diarrhea (normal for her).  Neurological:  Positive for dizziness, weakness and light-headedness.  Psychiatric/Behavioral:  Positive for confusion and decreased concentration. Negative for agitation.          Patient Active Problem List    Diagnosis Date Noted   AMS (altered mental status) 03/16/2021      Past Medical History:     Past Medical History:  Diagnosis Date   Diabetes mellitus without complication ([place])     HLD (hyperlipidemia)     Hypertension        Past Surgical History: Hysterectomy 1989 GB 1996 Bladder suspension x2 2000 Carpal tunnel 2007 Transvaginal tape 2002 Cystocele Repair  2007 TVT sling 2007   Social History: Smoking - quit 30-31 years ago Smoked 10-15 years total, up to 2 PPD at one time No recreational drugs ETOH - social drinker, hardly ever drink, last drink a couple nights ago when out to dinner with family   Family History: Mom passed from colon cancer Brother died from bladder cancer Dad died from heart history   Allergies and Medications: Lisinopril - cough Vicodin - itching     Objective: BP (!) 143/89   Pulse 76   Temp (!) 97.5 F (36.4 C) (Oral)   Resp 15   Ht 5\' 2"  (1.575 m)   Wt 64.4 kg    SpO2 91%   BMI 25.97 kg/m  Exam: General: Well appearing, good affect, elderly, white woman, NASD Eyes: PERRL, EOM intact ENTM: Moist mucus membranes Neck: Soft, no lymphadenopathy Cardiovascular: RRR, NRMG Respiratory: CTABL Gastrointestinal: Soft, NTTP, Non-distended MSK: Gross range of motion intact in all extremities, moving all independently Derm: No rashes Neuro: AxO to person, date, city, sensation intact throughout, motor function 5/5 in lower extremities, 4/5 in upper extremities Psych: Good affect, good mood   Labs and Imaging: CBC BMET  Last Labs        Recent Labs  Lab 03/16/21 0720 03/16/21 0740   WBC 4.9  --    HGB 12.4 12.6   HCT 38.9 37.0   PLT 211  --        Last Labs        Recent Labs  Lab 03/16/21 0720 03/16/21 0740   NA 141 142   K 3.8 3.7   CL 107 106   CO2 26  --    BUN 15 18   CREATININE 0.68 0.60   GLUCOSE 95 99   CALCIUM 8.4*  --           EKG: Sinus Rhythm at 72 bpm, Qtc 481, low voltage appearance, no ST elevation   CT HEAD WITHOUT CONTRAST 03/16/21 IMPRESSION: 1. No evidence of acute intracranial abnormality. 2. Chronic appearing paranasal sinusitis.   CT ANGIOGRAPHY HEAD AND NECK 03/16/21 IMPRESSION: No large vessel occlusion, hemodynamically significant stenosis, or evidence of dissection.   3 mm left upper lobe pulmonary nodule. No follow-up needed if patient is low-risk. Non-contrast chest CT can be considered in 12 months if patient is high-risk. This recommendation follows the consensus statement: Guidelines for Management of Incidental Pulmonary Nodules Detected on CT Images: From the Fleischner Society 2017; Radiology 2017; 284:228-243.   MRI HEAD WITHOUT CONTRAST 03/16/21 IMPRESSION: No acute infarction, hemorrhage, or mass. Mild chronic microvascular ischemic changes.   MRI CERVICAL SPINE WITHOUT CONTRAST 03/16/21 IMPRESSION: Suboptimal valuation due to motion degradation. No abnormal cord signal. Multilevel  degenerative changes without high-grade canal stenosis. Foraminal stenosis ranges from mild to moderate.       Holley Bouche, MD 03/16/2021, 8:34 PM PGY-1, [place] Intern pager: [phone], text pages welcome   Upper Level Addendum: I have seen and evaluated this patient along with Dr. Marcina Millard and reviewed the above note, making necessary revisions as appropriate. These are denoted by green text. I agree with the medical decision making and physical exam as noted above. Ezequiel Essex, MD PGY-2 [facility] Family Medicine Res[facility]

## 2021-03-17 NOTE — Consult Note (Signed)
NEUROLOGY CONSULTATION NOTE   Date of service: March 17, 2021 Patient Name: Jasmine Berg MRN:  [id number] DOB:  07-Oct-1940 Reason for consult: "Dysarthria, Generalized weakness and AMS" Requesting Provider: Billey Co, MD _ _ _   _ __   _ __ _ _  __ __   _ __   __ _  History of Present Illness  Jasmine Berg is a 80 y.o. female with PMH significant for DM2, HTN, HLD who is admitted with speech changes and generalized weakness with a LKW of 2130 on 03/15/21.  She reports that was not speaking clearly and weak and so her family brought her in. She is from [place], has had trouble with balance over the last few months including falls where she feels off balance and some almost falls. She reports taking Xanax about once a week and not very often. She feels much better now and feels that her weakness has significantly improved.  She reports mostly eating chicken or fish, does not eat vegetables or meats. Has been on this diet for several years. No hx of head injury, no vertigo, no hx of autoimmune diseases. She has no weakness, no urinary or bowel retention or incontinence/constipation. No prior similar symptoms.    ROS   Constitutional Denies weight loss, fever and chills.   HEENT Denies changes in vision and hearing.   Respiratory Denies SOB and cough.   CV Denies palpitations and CP   GI Denies abdominal pain, nausea, vomiting and diarrhea.   GU Denies dysuria and urinary frequency.   MSK Denies myalgia and joint pain.   Skin Denies rash and pruritus.   Neurological Denies headache and syncope.   Psychiatric Denies recent changes in mood. Denies anxiety and depression.    Past History   Past Medical History:  Diagnosis Date   Diabetes mellitus without complication (HCC)    HLD (hyperlipidemia)    Hypertension     Family History  Problem Relation Age of Onset   Colon cancer Mother    Heart disease Father    Bladder Cancer Brother    Social History   Socioeconomic  History   Marital status: Married    Spouse name: Not on file   Number of children: Not on file   Years of education: Not on file   Highest education level: Not on file  Occupational History   Not on file  Tobacco Use   Smoking status: Former    Types: Cigarettes   Smokeless tobacco: Not on file   Tobacco comments:    Smoked 10-15 years total, around 2 PPD. Quit about 1990.   Substance and Sexual Activity   Alcohol use: Yes    Comment: Social drinker   Drug use: Never   Sexual activity: Not on file  Other Topics Concern   Not on file  Social History Narrative   Not on file   Social Determinants of Health   Financial Resource Strain: Not on file  Food Insecurity: Not on file  Transportation Needs: Not on file  Physical Activity: Not on file  Stress: Not on file  Social Connections: Not on file   Allergies  Allergen Reactions   Hydrocodone-Acetaminophen Itching   Lisinopril Cough    Medications   No medications prior to admission.     Vitals   Vitals:   03/16/21 1900 03/16/21 1945 03/16/21 2208 03/16/21 2301  BP: 138/69 (!) 143/89 (!) 155/87 (!) 142/92  Pulse: 74 76 78 73  Resp: 17  15 16 17   Temp:   98 F (36.7 C) 98.2 F (36.8 C)  TempSrc:   Oral Oral  SpO2: 93% 91% 100% 99%  Weight:      Height:         Body mass index is 25.97 kg/m.  Physical Exam   General: Laying comfortably in bed; in no acute distress.  HENT: Normal oropharynx and mucosa. Normal external appearance of ears and nose.  Neck: Supple, no pain or tenderness  CV: No JVD. No peripheral edema.  Pulmonary: Symmetric Chest rise. Normal respiratory effort.  Abdomen: Soft to touch, non-tender.  Ext: No cyanosis, edema, or deformity  Skin: No rash. Normal palpation of skin.   Musculoskeletal: Normal digits and nails by inspection. No clubbing.   Neurologic Examination  Mental status/Cognition: Alert, oriented to self, place, month and year, good attention.  Speech/language: Fluent,  comprehension intact, object naming intact, repetition intact.  Cranial nerves:   CN II Pupils equal and reactive to light, no VF deficits    CN III,IV,VI EOM intact, no gaze preference or deviation, no nystagmus    CN V normal sensation in V1, V2, and V3 segments bilaterally    CN VII no asymmetry, no nasolabial fold flattening    CN VIII normal hearing to speech    CN IX & X normal palatal elevation, no uvular deviation    CN XI 5/5 head turn and 5/5 shoulder shrug bilaterally    CN XII midline tongue protrusion    Motor:  Muscle bulk: normal, tone normal, pronator drift none tremor none Mvmt Root Nerve  Muscle Right Left Comments  SA C5/6 Ax Deltoid 5 5   EF C5/6 Mc Biceps 5 5   EE C6/7/8 Rad Triceps 5 5   WF C6/7 Med FCR     WE C7/8 PIN ECU     F Ab C8/T1 U ADM/FDI 5 5   HF L1/2/3 Fem Illopsoas 5 5   KE L2/3/4 Fem Quad 5 5   DF L4/5 D Peron Tib Ant 5 5   PF S1/2 Tibial Grc/Sol 5 5    Reflexes:  Right Left Comments  Pectoralis      Biceps (C5/6) 1 1   Brachioradialis (C5/6) 2 2    Triceps (C6/7) 1 1    Patellar (L3/4) 0 0    Achilles (S1) 0 0    Hoffman      Plantar     Jaw jerk    Sensation:  Light touch intact   Pin prick Intact    Temperature intact   Vibration Decreased in BL feet  Proprioception Decreased in BL feet   Coordination/Complex Motor:  - Finger to Nose with mild ataxia and left worse than right. - Heel to shin with no ataxia. - Rapid alternating movement are normal. - Gait: Stride length short. Arm swing poor. Base width narrow. Rhomberg sign is positive.  Labs   CBC:  Recent Labs  Lab 03/16/21 0720 03/16/21 0740  WBC 4.9  --   NEUTROABS 1.9  --   HGB 12.4 12.6  HCT 38.9 37.0  MCV 90.5  --   PLT 211  --     Basic Metabolic Panel:  Lab Results  Component Value Date   NA 142 03/16/2021   K 3.7 03/16/2021   CO2 26 03/16/2021   GLUCOSE 99 03/16/2021   BUN 18 03/16/2021   CREATININE 0.60 03/16/2021   CALCIUM 8.4 (L) 03/16/2021    GFRNONAA >60 03/16/2021  Lipid Panel: No results found for: LDLCALC HgbA1c: No results found for: HGBA1C Urine Drug Screen:     Component Value Date/Time   LABOPIA NONE DETECTED 03/16/2021 0855   COCAINSCRNUR NONE DETECTED 03/16/2021 0855   LABBENZ POSITIVE (A) 03/16/2021 0855   AMPHETMU NONE DETECTED 03/16/2021 0855   THCU NONE DETECTED 03/16/2021 0855   LABBARB NONE DETECTED 03/16/2021 0855    Alcohol Level     Component Value Date/Time   ETH <10 03/16/2021 0720    CT Head without contrast: Personally reviewed ans CTH was negative for a large hypodensity concerning for a large territory infarct or hyperdensity concerning for an ICH  CT angio Head and Neck with contrast: Personally reviewed ans no LVO and no significant stenosis  MRI Brain and C spine w/o contrast: Personally reviwed and no acute abnormality, no cord compression.  Impression   Jasmine Berg is a 80 y.o. female with PMH significant for DM2, HTN, HLD who is admitted with speech changes and generalized weakness with a LKW of 2130 on 03/15/21 but has been having falls over the last few months. Compared to what was noted by Primary and the ED team, she appears much improved both cognitively and on exam with very mild sensory ataxia and positive rhomberg on my evaluation.  I suspect that her sudden worsening was likely related to Xanax use, no other obvious metabolic or electrolyte abnormalities. She does have underlying mild decrease in proprioception and vibratory sensation along with positive romberg all of which point to a sensory ataxia which could explain her falls over the last few months and her reporting that she feels off balance. I suspect that this is likely related to potential nutritional deficiency as she eats little and when she does, she mostly eats chicken and fish.  She tells me that she is only visiting town and has an appointment with a neurologist on January 3rd and would appreciate if she could  get most of her care in [place].  Impression - Sensory ataxia.  Recommendations  - Judicial use of Xanax. Benzodiazepines can cause ataxia and other cerebellar symptoms. - Vit B12 with MMA and Homocysteine, Serum Copper with Ceruloplasmin, B6, Vit E, Heavy metals, Folate, ANA with reflex to IFA if positive, RPR. - Symptoms could localize to a lesion in the dorsal column but given the length dependent decrease in vibration, I think we can hold off on MRI thoracic spine w/o contrast for now. If the workup above is non-revealing, would recommend getting MRI Thoracic spine at that time. - PT and OT. - Okay to discharge with outpatient workup and Neurology evaluation. Patient is from [place] and just visiting family in [place] and reports already scheduled for Neurology follow up appointment outpatient on Jan 3rd. - Would recommend starting PO Multivitamin with Multi-mineral after labs above are collected and continuing outpatient.(not ordered) ______________________________________________________________________   Thank you for the opportunity to take part in the care of this patient. If you have any further questions, please contact the neurology consultation attending.  Signed,  Erick Blinks [facility] Pager Number [phone] _ _ _   _ __   _ __ _ _  __ __   _ __   __ _

## 2021-03-18 LAB — ANA W/REFLEX IF POSITIVE: Anti Nuclear Antibody (ANA): NEGATIVE

## 2021-03-18 LAB — HOMOCYSTEINE: Homocysteine: 7.9 umol/L (ref 0.0–19.2)

## 2021-03-18 LAB — CERULOPLASMIN: Ceruloplasmin: 21.5 mg/dL (ref 19.0–39.0)

## 2021-03-18 NOTE — Progress Notes (Signed)
Called patient this morning and left message advising her to take 1/2 tablet of her amitriptyline instead of the whole tablet at night. This was not noted on the discharge paperwork and patient and family left abruptly yesterday. Also recommended discussing this change with her PCP.

## 2021-03-18 NOTE — Discharge Summary (Addendum)
Family Medicine Teaching [facility] Discharge Summary  Patient name: Jasmine Berg Medical record number: [id number] Date of birth: 12/05/1940 Age: 80 y.o. Gender: female Date of Admission: 03/16/2021  Date of Discharge: 03/17/2021 Admitting Physician: Bess Kinds, MD  Primary Care Provider: Pcp, No Consultants: Neurology, urology  Indication for Hospitalization: Presented with weakness, dysarthria, and altered mental status  Discharge Diagnoses/Problem List:  Urinary retention, history of urinary incontinence Hypertension Type 2 diabetes mellitus Depression, insomnia, benzodiazepine use GERD  Hospital problems resolved during this admission: Generalized weakness Altered mental status Dysarthria Hypoalbuminemia  Disposition: Home  Discharge Condition: Stable  Discharge Exam:  General: Elderly age congruent Female in no acute distress Cardiovascular: RRR, no murmurs nor abnormal heart sounds appreciated Respiratory: CTAB, normal effort on room air Abdomen: Soft, NT, with mild bladder distention appreciated Extremities: Nonedematous LE; SCDs in place Neurological: A&O x4, strength 4+ in BUE and 5/5 in BLE Psychiatric: Normal mood and affect  Brief Hospital Course:  Patient is a 80 year old female presenting with weakness, dysarthria and altered mental status. PMH of T2DM, HTN, HLD, arthritis, acid reflux, and urinary incontinence.  Weakness  AMS Resolved by the time of admission to [facility]. Likely 2/2 medication-induced urinary retention, but cannot fully rule out TIA. CT Head and MRI Head WNL.  Per neuro: Okay to discharge with outpatient workup and Neurology evaluation. Patient is from [place] and just visiting family in [place] and reports already scheduled for Neurology follow up appointment outpatient on Jan 3rd.  Urinary Retention Patient straight cath 800 cc void in ED. On the unit, bladder scan showed retention of 497 mL retained; straight cath 600 cc.  Urology to place foley catheter. Home amitriptyline will be decreased to 37.5mg  on discharge and advise discontinuation of Xanax, as additive effect can cause urinary retention.    Discharge recommendations: Incidental 3 mm left upper lobe pulmonary nodule found 11/8 on CTA head/neck. No follow-up needed if patient is low-risk. Non-contrast chest CT can be considered in 12 months if patient is high-risk. Amitriptyline dosage decreased to 37.5mg  at discharge; take one half of the pill instead of the full 75 mg tablet. Consider decreasing amount of sedating medications/potential medication interactions with polypharmacy in elderly (Xanax in combination with Amitriptyline) Urology placed foley catheter for urinary retention. Follow-up with PCP for management and safe removal.     Significant Procedures: Foley catheter placement  Significant Labs and Imaging:  Recent Labs  Lab 03/16/21 0720 03/16/21 0740  WBC 4.9  --   HGB 12.4 12.6  HCT 38.9 37.0  PLT 211  --    Recent Labs  Lab 03/16/21 0720 03/16/21 0740 03/17/21 0337  NA 141 142 139  K 3.8 3.7 3.8  CL 107 106 101  CO2 26  --  29  GLUCOSE 95 99 106*  BUN 15 18 14   CREATININE 0.68 0.60 0.75  CALCIUM 8.4*  --  8.9  ALKPHOS 69  --   --   AST 24  --   --   ALT 22  --   --   ALBUMIN 3.1*  --   --    RPR, HIV NR Vitamin B12, folate, ceruloplasmin, TSH WNL   Results/Tests Pending at Time of Discharge: MMA, vitamin B1, vitamin D, homocystine, heavy metals, copper, ANA  Discharge Medications:  Allergies as of 03/17/2021       Reactions   Hydrocodone-acetaminophen Itching   Lisinopril Cough        Medication List     TAKE  these medications    ALPRAZolam 0.25 MG tablet Commonly known as: XANAX Take 0.125 mg by mouth 2 (two) times daily as needed for anxiety.   amitriptyline 75 MG tablet Commonly known as: ELAVIL Take 75 mg by mouth at bedtime.   amLODipine 5 MG tablet Commonly known as: NORVASC Take 5 mg by  mouth daily.   aspirin EC 81 MG tablet Take 81 mg by mouth daily. Swallow whole.   FLUoxetine 40 MG capsule Commonly known as: PROZAC Take 40 mg by mouth daily. Take with 20mg  capsules for a total daily dose of 60mg    FLUoxetine 20 MG capsule Commonly known as: PROZAC Take 20 mg by mouth daily. Take with 40mg  capsules for a total daily dose of 60mg    glipiZIDE-metformin 2.5-500 MG tablet Commonly known as: METAGLIP Take 1 tablet by mouth 2 (two) times daily.   ibuprofen 200 MG tablet Commonly known as: ADVIL Take 400 mg by mouth every 6 (six) hours as needed for headache or mild pain.   losartan 100 MG tablet Commonly known as: COZAAR Take 100 mg by mouth daily.   meloxicam 7.5 MG tablet Commonly known as: MOBIC Take 7.5 mg by mouth 2 (two) times daily as needed for pain.   omeprazole 20 MG capsule Commonly known as: PRILOSEC Take 20 mg by mouth 2 (two) times daily.   pravastatin 80 MG tablet Commonly known as: PRAVACHOL Take 80 mg by mouth daily.        Discharge Instructions: Please refer to Patient Instructions section of EMR for full details.  Patient was counseled important signs and symptoms that should prompt return to medical care, changes in medications, dietary instructions, activity restrictions, and follow up appointments.   Follow-Up Appointments:   Patient has follow-up with neurologist on May 11, 2021 in [place].  Rosezetta Schlatter, MD 03/18/2021, 8:17 AM PGY-1, [facility] Addendum   I have independently interviewed and examined the patient. I have discussed the above with the original author and agree with their documentation. My edits for correction/addition/clarification are in within the document. Please see also any attending notes.   Shary Key, DO PGY-2, [facility] 03/18/2021 9:05 PM  [facility] pager: [phone] (text pages welcome through [place])

## 2021-03-19 LAB — HEAVY METALS, BLOOD
Arsenic: 5 ug/L (ref 0–9)
Lead: <1 ug/dL (ref 0.0–3.4)
Mercury: <1 ug/L (ref 0.0–14.9)

## 2021-03-19 LAB — COPPER, SERUM: Copper: 96 ug/dL (ref 80–158)

## 2021-03-21 LAB — CULTURE, BLOOD (ROUTINE X 2)
Culture: NO GROWTH
Culture: NO GROWTH
Special Requests: ADEQUATE

## 2021-03-21 LAB — METHYLMALONIC ACID, SERUM: Methylmalonic Acid, Quantitative: 208 nmol/L (ref 0–378)

## 2021-03-23 LAB — VITAMIN E
Vitamin E (Alpha Tocopherol): 12.2 mg/L (ref 9.0–29.0)
Vitamin E(Gamma Tocopherol): 1.6 mg/L (ref 0.5–4.9)

## 2021-03-25 LAB — VITAMIN B1: Vitamin B1 (Thiamine): 106.5 nmol/L (ref 66.5–200.0)
